# Patient Record
Sex: Female | Born: 1955 | Race: White | Hispanic: No | Marital: Married | Smoking: Former smoker
Health system: Southern US, Community
[De-identification: ages and names within clinical notes are randomized; demographics above are authoritative.]

## PROBLEM LIST (undated history)

## (undated) DIAGNOSIS — G43909 Migraine, unspecified, not intractable, without status migrainosus: Secondary | ICD-10-CM

## (undated) DIAGNOSIS — F329 Major depressive disorder, single episode, unspecified: Secondary | ICD-10-CM

## (undated) DIAGNOSIS — F32A Depression, unspecified: Secondary | ICD-10-CM

## (undated) HISTORY — PX: CHOLECYSTECTOMY: SHX55

---

## 1998-12-09 ENCOUNTER — Other Ambulatory Visit: Admission: RE | Admit: 1998-12-09 | Discharge: 1998-12-09 | Payer: Self-pay | Admitting: Obstetrics & Gynecology

## 1999-05-19 ENCOUNTER — Other Ambulatory Visit: Admission: RE | Admit: 1999-05-19 | Discharge: 1999-05-19 | Payer: Self-pay | Admitting: Obstetrics & Gynecology

## 1999-05-27 ENCOUNTER — Encounter: Payer: Self-pay | Admitting: Obstetrics & Gynecology

## 1999-05-27 ENCOUNTER — Ambulatory Visit (HOSPITAL_COMMUNITY): Admission: RE | Admit: 1999-05-27 | Discharge: 1999-05-27 | Payer: Self-pay | Admitting: Obstetrics & Gynecology

## 2011-03-13 DIAGNOSIS — F32A Depression, unspecified: Secondary | ICD-10-CM | POA: Insufficient documentation

## 2011-03-13 DIAGNOSIS — F329 Major depressive disorder, single episode, unspecified: Secondary | ICD-10-CM | POA: Insufficient documentation

## 2011-09-02 DIAGNOSIS — G43909 Migraine, unspecified, not intractable, without status migrainosus: Secondary | ICD-10-CM | POA: Insufficient documentation

## 2012-10-16 ENCOUNTER — Emergency Department (INDEPENDENT_AMBULATORY_CARE_PROVIDER_SITE_OTHER)
Admission: EM | Admit: 2012-10-16 | Discharge: 2012-10-16 | Disposition: A | Discharge status: Home / Self Care | Payer: BC Managed Care – PPO | Source: Home / Self Care | Attending: Emergency Medicine | Admitting: Emergency Medicine

## 2012-10-16 DIAGNOSIS — J322 Chronic ethmoidal sinusitis: Secondary | ICD-10-CM

## 2012-10-16 HISTORY — DX: Major depressive disorder, single episode, unspecified: F32.9

## 2012-10-16 HISTORY — DX: Depression, unspecified: F32.A

## 2012-10-16 MED ORDER — AMOXICILLIN-POT CLAVULANATE 875-125 MG PO TABS: 1.0000 | ORAL_TABLET | Freq: Two times a day (BID) | ORAL | Status: DC

## 2012-10-16 NOTE — ED Provider Notes (Signed)
SINUSITIS  Onset: 21 days Facial/sinus pressure with discolored nasal mucus.    Severity: moderate, much worse in the past few days  Tried OTC meds with minimal relief.  Symptoms:  + Fever  + URI prodrome with nasal congestion + Minimal swollen neck glands + mild Sinus Headache No ear pressure  No Allergy symptoms No significant Sore Throat No eye symptoms     No significant Cough No chest pain No shortness of breath  No wheezing  No Abdominal Pain No Nausea No Vomiting No diarrhea  No Myalgias No focal neurologic symptoms No syncope No Rash  No Urinary symptoms        CSN: [id number]     Arrival date & time 10/16/12  1254 History   First MD Initiated Contact with Patient 10/16/12 1308     Chief Complaint  Patient presents with  . Facial Pain    The history is provided by the patient.    Past Medical History  Diagnosis Date  . Depression    Past Surgical History  Procedure Laterality Date  . Cholecystectomy     Family History  Problem Relation Age of Onset  . Cancer Father   . Cancer Sister     breast   History  Substance Use Topics  . Smoking status: Never Smoker   . Smokeless tobacco: Not on file  . Alcohol Use: No   OB History   Grav Para Term Preterm Abortions TAB SAB Ect Mult Living                 Review of Systems  All other systems reviewed and are negative.    Allergies  Review of patient's allergies indicates no known allergies.  Home Medications   Current Outpatient Rx  Name  Route  Sig  Dispense  Refill  . venlafaxine XR (EFFEXOR-XR) 150 MG 24 hr capsule   Oral   Take 150 mg by mouth daily.         Marland Kitchen amoxicillin-clavulanate (AUGMENTIN) 875-125 MG per tablet   Oral   Take 1 tablet by mouth 2 (two) times daily. Take with food.   20 tablet   0    BP 123/73  Pulse 74  Temp(Src) 98.1 F (36.7 C) (Oral)  Ht 5\' 7"  (1.702 m)  Wt 196 lb (88.905 kg)  BMI 30.69 kg/m2  SpO2 96% Physical Exam  Nursing note and  vitals reviewed. Constitutional: She is oriented to person, place, and time. She appears well-developed and well-nourished. No distress.  HENT:  Head: Normocephalic and atraumatic.  Right Ear: Tympanic membrane, external ear and ear canal normal.  Left Ear: Tympanic membrane, external ear and ear canal normal.  Nose: Mucosal edema and rhinorrhea present. Right sinus exhibits maxillary sinus tenderness. Left sinus exhibits maxillary sinus tenderness.  Mouth/Throat: Oropharynx is clear and moist. No oral lesions. No oropharyngeal exudate.  Eyes: Right eye exhibits no discharge. Left eye exhibits no discharge. No scleral icterus.  Neck: Neck supple.  Cardiovascular: Normal rate, regular rhythm and normal heart sounds.   Pulmonary/Chest: Effort normal and breath sounds normal. She has no wheezes. She has no rales.  Lymphadenopathy:    She has no cervical adenopathy.  Neurological: She is alert and oriented to person, place, and time.  Skin: Skin is warm and dry.    ED Course  Procedures (including critical care time) Labs Review Labs Reviewed - No data to display Imaging Review No results found.  MDM   1. Ethmoid sinusitis  Treatment options discussed. After risks, benefits, alternatives discussed, patient agrees with the following plans: Augmentin 875 twice a day x10 days.  Mucinex D twice a day Other symptomatic care discussed. Excedrin when necessary headache, as that has been effective for sinus headache Follow-up with your primary care doctor in 5-7 days if not improving, or sooner if symptoms become worse. Precautions discussed. Red flags discussed. Questions invited and answered. Patient voiced understanding and agreement.    Lajean Manes, MD 10/16/12 1335

## 2012-10-16 NOTE — ED Notes (Signed)
Symptoms started one month ago and is getting worse, noted with productive cough and headaches; rates pain 8/10

## 2012-12-04 ENCOUNTER — Encounter: Payer: Self-pay | Admitting: Emergency Medicine

## 2012-12-04 ENCOUNTER — Emergency Department (INDEPENDENT_AMBULATORY_CARE_PROVIDER_SITE_OTHER)
Admission: EM | Admit: 2012-12-04 | Discharge: 2012-12-04 | Disposition: A | Discharge status: Home / Self Care | Payer: BC Managed Care – PPO | Source: Home / Self Care | Attending: Family Medicine | Admitting: Family Medicine

## 2012-12-04 DIAGNOSIS — J069 Acute upper respiratory infection, unspecified: Secondary | ICD-10-CM

## 2012-12-04 DIAGNOSIS — J029 Acute pharyngitis, unspecified: Secondary | ICD-10-CM

## 2012-12-04 LAB — POCT RAPID STREP A (OFFICE): Rapid Strep A Screen: NEGATIVE

## 2012-12-04 MED ORDER — PREDNISONE 20 MG PO TABS: 20.0000 mg | ORAL_TABLET | Freq: Two times a day (BID) | ORAL | Status: DC

## 2012-12-04 MED ORDER — ALBUTEROL SULFATE HFA 108 (90 BASE) MCG/ACT IN AERS: 2.0000 | INHALATION_SPRAY | RESPIRATORY_TRACT | Status: DC | PRN

## 2012-12-04 MED ORDER — AMOXICILLIN 875 MG PO TABS: 875.0000 mg | ORAL_TABLET | Freq: Two times a day (BID) | ORAL | Status: DC

## 2012-12-04 MED ORDER — BENZONATATE 200 MG PO CAPS: 200.0000 mg | ORAL_CAPSULE | Freq: Every day | ORAL | Status: DC

## 2012-12-04 NOTE — ED Notes (Signed)
Tammy Carrillo complains of sore throat and left ear pain for 3 days. She feels tired and has chills. Denies fever or sweats. She had a little dizziness yesterday.

## 2012-12-04 NOTE — ED Provider Notes (Signed)
CSN: [id number]     Arrival date & time 12/04/12  1100 History   First MD Initiated Contact with Patient 12/04/12 1112     Chief Complaint  Patient presents with  . Sore Throat    x 3 days  . Otalgia    left ear pain for 3 days      HPI Comments: Patient developed a left sore throat and fatigue 3 days ago.  Yesterday she had mild left earache and neck soreness.  She has had chills.  No cough or nasal congestion. She has a history of mild asthma but only needs to use her albuterol MDI when she gets a cold.  The history is provided by the patient.    Past Medical History  Diagnosis Date  . Depression    Past Surgical History  Procedure Laterality Date  . Cholecystectomy     Family History  Problem Relation Age of Onset  . Cancer Father   . Cancer Sister     breast   History  Substance Use Topics  . Smoking status: Never Smoker   . Smokeless tobacco: Not on file  . Alcohol Use: No   OB History   Grav Para Term Preterm Abortions TAB SAB Ect Mult Living                 Review of Systems + sore throat No cough No pleuritic pain No wheezing No nasal congestion ? post-nasal drainage No sinus pain/pressure No itchy/red eyes ? left earache No hemoptysis No SOB No fever, + chills No nausea + dizzy No vomiting No abdominal pain No diarrhea No urinary symptoms No skin rash + fatigue No myalgias No headache    Allergies  Review of patient's allergies indicates no known allergies.  Home Medications   Current Outpatient Rx  Name  Route  Sig  Dispense  Refill  . venlafaxine XR (EFFEXOR-XR) 150 MG 24 hr capsule   Oral   Take 150 mg by mouth daily.         Marland Kitchen albuterol (PROVENTIL HFA;VENTOLIN HFA) 108 (90 BASE) MCG/ACT inhaler   Inhalation   Inhale 2 puffs into the lungs every 4 (four) hours as needed for wheezing or shortness of breath.   1 Inhaler   0   . amoxicillin (AMOXIL) 875 MG tablet   Oral   Take 1 tablet (875 mg total) by mouth 2 (two) times  daily. (Rx void after 12/12/12)   20 tablet   0   . benzonatate (TESSALON) 200 MG capsule   Oral   Take 1 capsule (200 mg total) by mouth at bedtime. Take as needed for cough   12 capsule   0   . predniSONE (DELTASONE) 20 MG tablet   Oral   Take 1 tablet (20 mg total) by mouth 2 (two) times daily.   10 tablet   0    BP 144/78  Pulse 72  Temp(Src) 98 F (36.7 C) (Oral)  Ht 5\' 7"  (1.702 m)  Wt 194 lb (87.998 kg)  BMI 30.38 kg/m2  SpO2 96% Physical Exam Nursing notes and Vital Signs reviewed. Appearance:  Patient appears stated age, and in no acute distress.  Patient is obese (BMI 30.4) Eyes:  Pupils are equal, round, and reactive to light and accomodation.  Extraocular movement is intact.  Conjunctivae are not inflamed  Ears:  Canals normal.  Tympanic membranes normal.  Nose:  Mildly congested turbinates.  No sinus tenderness.  Pharynx:  Normal Neck:  Supple.   Tender shotty posterior nodes are palpated bilaterally  Lungs:  Clear to auscultation.  Breath sounds are equal.  Heart:  Regular rate and rhythm without murmurs, rubs, or gallops.  Abdomen:  Nontender without masses or hepatosplenomegaly.  Bowel sounds are present.  No CVA or flank tenderness.  Extremities:  No edema.  No calf tenderness Skin:  No rash present.    ED Course  Procedures  none    Labs Reviewed  POCT RAPID STREP A (OFFICE) - Normal         MDM   1. Sore throat   2. Acute upper respiratory infections of unspecified site    Throat culture pending. There is no evidence of bacterial infection today.  Treat symptomatically for now  With a past history of mild asthma, will begin a Prednisone burst.  Refill albuterol MDI. If cough develops, begin Tessalon perles at bedtime. Take Mucinex D (guaifenesin with decongestant) twice daily for cough and congestion.  Increase fluid intake, rest. May use Afrin nasal spray (or generic oxymetazoline) twice daily for about 5 days.  Also recommend using  saline nasal spray several times daily and saline nasal irrigation (AYR is a common brand) Stop all antihistamines for now, and other non-prescription cough/cold preparations. Continue albuterol inhaler as needed Begin Amoxicillin if not improving about one week or if persistent fever develops (Given a prescription to hold, with an expiration date)  Follow-up with family doctor if not improving about10 days.     Lattie Haw, MD 12/04/12 1144

## 2012-12-05 LAB — STREP A DNA PROBE: GASP: NEGATIVE

## 2012-12-06 ENCOUNTER — Telehealth: Payer: Self-pay | Admitting: *Deleted

## 2013-01-07 ENCOUNTER — Emergency Department (INDEPENDENT_AMBULATORY_CARE_PROVIDER_SITE_OTHER)
Admission: EM | Admit: 2013-01-07 | Discharge: 2013-01-07 | Disposition: A | Discharge status: Home / Self Care | Payer: BC Managed Care – PPO | Source: Home / Self Care | Attending: Emergency Medicine | Admitting: Emergency Medicine

## 2013-01-07 ENCOUNTER — Encounter: Payer: Self-pay | Admitting: Emergency Medicine

## 2013-01-07 DIAGNOSIS — J111 Influenza due to unidentified influenza virus with other respiratory manifestations: Secondary | ICD-10-CM

## 2013-01-07 MED ORDER — OSELTAMIVIR PHOSPHATE 75 MG PO CAPS: ORAL_CAPSULE | ORAL | Status: DC

## 2013-01-07 NOTE — ED Notes (Addendum)
Tammy Carrillo complains of chills, body aches and runny nose since this morning. Denies fever or sweats. She states she has been exposed to the flu virus. She would prefer not to get a flu test.

## 2013-01-07 NOTE — ED Provider Notes (Addendum)
CSN: [id number]     Arrival date & time 01/07/13  1029 History   First MD Initiated Contact with Patient 01/07/13 1053     Chief Complaint  Patient presents with  . Chills    this am  . Generalized Body Aches    this am  . Nasal Congestion    this am   (Consider location/radiation/quality/duration/timing/severity/associated sxs/prior Treatment) HPI Tammy Carrillo complains of chills, body aches and runny nose since this morning. Denies fever or sweats. She works as a Runner, broadcasting/film/video, and describes that 2 days ago, a Consulting civil engineer sneezed in her face, later reportedly had influenza with a documentED positive flu test. She would prefer not to get a flu test. --She specifically requests to start Tamiflu now  She has not had a flu shot this season.  URI HISTORY  Alyssandra is a 57 y.o. female who complains of onset of cold and flu symptoms for 1 day.  Has not been using any over-the-counter treatment.  Positive chills/sweats, and low-grade fever  +  Minimal Nasal congestion No Discolored Post-nasal drainage No sinus pain/pressure No sore throat  No  cough No wheezing No chest congestion No hemoptysis No shortness of breath No pleuritic pain  No itchy/red eyes No earache  No nausea No vomiting No abdominal pain No diarrhea  No skin rashes +  Fatigue Positive, severe myalgias No headache  Past Medical History  Diagnosis Date  . Depression    Past Surgical History  Procedure Laterality Date  . Cholecystectomy     Family History  Problem Relation Age of Onset  . Cancer Father   . Stroke Father   . Diabetes Father   . Cancer Sister     breast   History  Substance Use Topics  . Smoking status: Never Smoker   . Smokeless tobacco: Not on file  . Alcohol Use: No   OB History   Grav Para Term Preterm Abortions TAB SAB Ect Mult Living                 Review of Systems  All other systems reviewed and are negative.    Allergies  Review of patient's allergies indicates no  known allergies.  Home Medications   Current Outpatient Rx  Name  Route  Sig  Dispense  Refill  . venlafaxine XR (EFFEXOR-XR) 150 MG 24 hr capsule   Oral   Take 150 mg by mouth daily.         . benzonatate (TESSALON) 200 MG capsule   Oral   Take 1 capsule (200 mg total) by mouth at bedtime. Take as needed for cough   12 capsule   0   . oseltamivir (TAMIFLU) 75 MG capsule      Starting today, take 1 capsule by mouth twice a day for 5 days.   10 capsule   0    BP 138/87  Pulse 72  Temp(Src) 98.1 F (36.7 C) (Oral)  Ht 5\' 7"  (1.702 m)  Wt 193 lb (87.544 kg)  BMI 30.22 kg/m2  SpO2 96% Physical Exam  Nursing note and vitals reviewed. Constitutional: She appears well-developed and well-nourished.  Non-toxic appearance. She appears ill (very fatigued, but no cardiorespiratory distress). No distress.  HENT:  Head: Normocephalic and atraumatic.  Right Ear: Tympanic membrane and external ear normal.  Left Ear: Tympanic membrane and external ear normal.  Nose: Rhinorrhea present.  Mouth/Throat: Mucous membranes are normal. Posterior oropharyngeal erythema (mild redness ) present. No oropharyngeal exudate.  Eyes: Conjunctivae  are normal. Right eye exhibits no discharge. Left eye exhibits no discharge. No scleral icterus.  Neck: Neck supple.  Cardiovascular: Normal rate, regular rhythm and normal heart sounds.   Pulmonary/Chest: Breath sounds normal. No stridor. No respiratory distress. She has no wheezes. She has no rales.  Abdominal: Soft. There is no tenderness.  Musculoskeletal: She exhibits no edema.  Lymphadenopathy:    She has cervical adenopathy (mild shoddy anterior cervical nodes).  Neurological: She is alert.  Skin: Skin is warm and intact. No rash noted. She is diaphoretic.  Psychiatric: She has a normal mood and affect.    ED Course  Procedures (including critical care time) Labs Review Labs Reviewed - No data to display Imaging Review No results  found.  EKG Interpretation    Date/Time:    Ventricular Rate:    PR Interval:    QRS Duration:   QT Interval:    QTC Calculation:   R Axis:     Text Interpretation:              MDM   1. Influenza with respiratory manifestations    Treatment options discussed. By her history and physical findings, there is a high likelihood that this is influenza. She declined flu test. Rx for Tamiflu 75 twice a day x5 days. Other symptomatic care discussed Followup with PCP if no better one week, sooner when necessary Precautions discussed. Red flags discussed. Questions invited and answered. Patient voiced understanding and agreement.  Lajean Manes, MD 01/07/13 1107  Addendum: Also advised patient that once this acute illness has resolved, she should get the seasonal flu shot.-She voiced understanding and agreement.  Lajean Manes, MD 01/07/13 [phone]

## 2013-04-15 ENCOUNTER — Emergency Department (INDEPENDENT_AMBULATORY_CARE_PROVIDER_SITE_OTHER)
Admission: EM | Admit: 2013-04-15 | Discharge: 2013-04-15 | Disposition: A | Discharge status: Home / Self Care | Payer: BC Managed Care – PPO | Source: Home / Self Care | Attending: Family Medicine | Admitting: Family Medicine

## 2013-04-15 ENCOUNTER — Encounter: Payer: Self-pay | Admitting: Emergency Medicine

## 2013-04-15 DIAGNOSIS — J069 Acute upper respiratory infection, unspecified: Secondary | ICD-10-CM

## 2013-04-15 MED ORDER — BENZONATATE 200 MG PO CAPS: 200.0000 mg | ORAL_CAPSULE | Freq: Every day | ORAL | Status: DC

## 2013-04-15 MED ORDER — AMOXICILLIN 875 MG PO TABS: 875.0000 mg | ORAL_TABLET | Freq: Two times a day (BID) | ORAL | Status: DC

## 2013-04-15 MED ORDER — PREDNISONE 20 MG PO TABS: 20.0000 mg | ORAL_TABLET | Freq: Two times a day (BID) | ORAL | Status: DC

## 2013-04-15 NOTE — Discharge Instructions (Signed)
Take plain Mucinex (1200 mg guaifenesin) twice daily for cough and congestion.  May add Sudafed for sinus congestion.   Increase fluid intake, rest. May use Afrin nasal spray (or generic oxymetazoline) twice daily for about 5 days.  Also recommend using saline nasal spray several times daily and saline nasal irrigation (AYR is a common brand).  Use Flonase after Afrin spray and saline rinse.  Stop all antihistamines for now, and other non-prescription cough/cold preparations.  Follow-up with family doctor if not improving 7 to 10 days.

## 2013-04-15 NOTE — ED Provider Notes (Signed)
CSN: [id number]     Arrival date & time 04/15/13  1531 History   First MD Initiated Contact with Patient 04/15/13 1551     Chief Complaint  Patient presents with  . Headache  . Nasal Congestion  . Fatigue   (Consider location/radiation/quality/duration/timing/severity/associated sxs/prior Treatment) HPI Comments: Patient developed sinus congestion about 6 days ago, then scratchy throat, fatigue, and headache about 4 days ago.  She developed chills and cough yesterday.  She has occasional wheezing and has resumed using her albuterol inhaler.  She also resumed using a Flonase nasal inhaler.  The history is provided by the patient.    Past Medical History  Diagnosis Date  . Depression    Past Surgical History  Procedure Laterality Date  . Cholecystectomy     Family History  Problem Relation Age of Onset  . Cancer Father   . Stroke Father   . Diabetes Father   . Cancer Sister     breast   History  Substance Use Topics  . Smoking status: Never Smoker   . Smokeless tobacco: Not on file  . Alcohol Use: No   OB History   Grav Para Term Preterm Abortions TAB SAB Ect Mult Living                 Review of Systems + sore throat + cough No pleuritic pain + wheezing + nasal congestion + post-nasal drainage No sinus pain/pressure No itchy/red eyes No earache No hemoptysis No SOB No fever, + chills No nausea No vomiting No abdominal pain No diarrhea No urinary symptoms No skin rash + fatigue No myalgias + headache Used OTC meds without relief  Allergies  Review of patient's allergies indicates no known allergies.  Home Medications   Current Outpatient Rx  Name  Route  Sig  Dispense  Refill  . amoxicillin (AMOXIL) 875 MG tablet   Oral   Take 1 tablet (875 mg total) by mouth 2 (two) times daily.   20 tablet   0   . benzonatate (TESSALON) 200 MG capsule   Oral   Take 1 capsule (200 mg total) by mouth at bedtime. Take as needed for cough   12 capsule    0   . predniSONE (DELTASONE) 20 MG tablet   Oral   Take 1 tablet (20 mg total) by mouth 2 (two) times daily. Take with food.   10 tablet   0   . venlafaxine XR (EFFEXOR-XR) 150 MG 24 hr capsule   Oral   Take 150 mg by mouth daily.          BP 131/85  Pulse 83  Temp(Src) 97.8 F (36.6 C) (Oral)  Resp 16  Ht 5\' 7"  (1.702 m)  Wt 194 lb (87.998 kg)  BMI 30.38 kg/m2  SpO2 96% Physical Exam Nursing notes and Vital Signs reviewed. Appearance:  Patient appears stated age, and in no acute distress.  Patient is obese (BMI 30.4) Eyes:  Pupils are equal, round, and reactive to light and accomodation.  Extraocular movement is intact.  Conjunctivae are not inflamed  Ears:  Canals normal.  Tympanic membranes normal.  Nose:  Mildly congested turbinates.  No sinus tenderness.  Pharynx:  Normal Neck:  Supple.   Tender enlarged posterior nodes are palpated bilaterally  Lungs:  Clear to auscultation.  Breath sounds are equal.  Heart:  Regular rate and rhythm without murmurs, rubs, or gallops.  Abdomen:  Nontender without masses or hepatosplenomegaly.  Bowel sounds are present.  No CVA or flank tenderness.  Extremities:  No edema.  No calf tenderness Skin:  No rash present.   ED Course  Procedures  none      MDM   1. Acute upper respiratory infections of unspecified site; suspect viral URI with intermittent bronchospasm    Begin amoxicillin and prednisone burst.  Prescription written for Benzonatate (Tessalon) to take at bedtime for night-time cough.  Take plain Mucinex (1200 mg guaifenesin) twice daily for cough and congestion.  May add Sudafed for sinus congestion.   Increase fluid intake, rest. May use Afrin nasal spray (or generic oxymetazoline) twice daily for about 5 days.  Also recommend using saline nasal spray several times daily and saline nasal irrigation (AYR is a common brand).  Use Flonase after Afrin spray and saline rinse.  Stop all antihistamines for now, and other  non-prescription cough/cold preparations.  Follow-up with family doctor if not improving 7 to 10 days.     Kandra Nicolas, MD 04/15/13 [phone]

## 2013-04-15 NOTE — ED Notes (Signed)
Reports 4 day history of congestion with addition of headaches and fatigue. Took Day-Quil this a.m.Marland Kitchen

## 2013-06-22 ENCOUNTER — Emergency Department (INDEPENDENT_AMBULATORY_CARE_PROVIDER_SITE_OTHER)
Admission: EM | Admit: 2013-06-22 | Discharge: 2013-06-22 | Disposition: A | Discharge status: Home / Self Care | Payer: BC Managed Care – PPO | Source: Home / Self Care | Attending: Emergency Medicine | Admitting: Emergency Medicine

## 2013-06-22 ENCOUNTER — Encounter: Payer: Self-pay | Admitting: Emergency Medicine

## 2013-06-22 DIAGNOSIS — J209 Acute bronchitis, unspecified: Secondary | ICD-10-CM

## 2013-06-22 DIAGNOSIS — J0100 Acute maxillary sinusitis, unspecified: Secondary | ICD-10-CM

## 2013-06-22 MED ORDER — CLARITHROMYCIN 500 MG PO TABS: ORAL_TABLET | ORAL | Status: DC

## 2013-06-22 MED ORDER — PREDNISONE 20 MG PO TABS: 20.0000 mg | ORAL_TABLET | Freq: Two times a day (BID) | ORAL | Status: DC

## 2013-06-22 MED ORDER — IPRATROPIUM-ALBUTEROL 0.5-2.5 (3) MG/3ML IN SOLN
3.0000 mL | Freq: Once | RESPIRATORY_TRACT | Status: AC
  Administered 2013-06-22: 3 mL via RESPIRATORY_TRACT

## 2013-06-22 NOTE — ED Provider Notes (Signed)
CSN: [id number]     Arrival date & time 06/22/13  1221 History   First MD Initiated Contact with Patient 06/22/13 1249     Chief Complaint  Patient presents with  . Nasal Congestion   HPI URI HISTORY  Vida is a 58 y.o. female who complains of onset of cold symptoms for several days. Cough, congestion, sinus pain, pressure, sweating x 2 weeks worse last two days.   Had mild wheezing with chest congestion, tried albuterol rescue inhaler, which helped a little.  She states that the last time she needed a rescue inhaler was about a year ago.  + chills/sweats +  Fever  +  Nasal congestion +  Discolored Post-nasal drainage + sinus pain/pressure No sore throat  +  Hacking cough, productive of discolored sputum + wheezing + chest congestion No hemoptysis Mild shortness of breath when she is wheezing. No pleuritic pain  No itchy/red eyes No earache  Minimal nausea. Decreased appetite but tolerating by mouth  No vomiting No abdominal pain No diarrhea  No skin rashes +  Fatigue No myalgias Mild, nonfocal headache, without any focal neurologic symptoms. No lightheadedness or syncope   Past Medical History  Diagnosis Date  . Depression    Past Surgical History  Procedure Laterality Date  . Cholecystectomy     Family History  Problem Relation Age of Onset  . Cancer Father   . Stroke Father   . Diabetes Father   . Cancer Sister     breast   History  Substance Use Topics  . Smoking status: Never Smoker   . Smokeless tobacco: Not on file  . Alcohol Use: No   OB History   Grav Para Term Preterm Abortions TAB SAB Ect Mult Living                 Review of Systems  All other systems reviewed and are negative.   Allergies  Review of patient's allergies indicates not on file.  Home Medications   Prior to Admission medications   Medication Sig Start Date End Date Taking? Authorizing Provider  phenylephrine (SUDAFED PE) 10 MG TABS tablet Take 10 mg by mouth  every 4 (four) hours as needed.   Yes Historical Provider, MD  amoxicillin (AMOXIL) 875 MG tablet Take 1 tablet (875 mg total) by mouth 2 (two) times daily. 04/15/13   Kandra Nicolas, MD  benzonatate (TESSALON) 200 MG capsule Take 1 capsule (200 mg total) by mouth at bedtime. Take as needed for cough 04/15/13   Kandra Nicolas, MD  predniSONE (DELTASONE) 20 MG tablet Take 1 tablet (20 mg total) by mouth 2 (two) times daily. Take with food. 04/15/13   Kandra Nicolas, MD  venlafaxine XR (EFFEXOR-XR) 150 MG 24 hr capsule Take 150 mg by mouth daily.    Historical Provider, MD   BP 148/87  Pulse 86  Temp(Src) 97.8 F (36.6 C) (Oral)  Ht 5\' 7"  (1.702 m)  Wt 190 lb (86.183 kg)  BMI 29.75 kg/m2  SpO2 98% Physical Exam  Nursing note and vitals reviewed. Constitutional: She is oriented to person, place, and time. She appears well-developed and well-nourished. No distress.  HENT:  Head: Normocephalic and atraumatic.  Right Ear: Tympanic membrane, external ear and ear canal normal.  Left Ear: Tympanic membrane, external ear and ear canal normal.  Nose: Mucosal edema and rhinorrhea present. Right sinus exhibits maxillary sinus tenderness. Left sinus exhibits maxillary sinus tenderness.  Mouth/Throat: Oropharynx is clear and moist.  No oral lesions. No oropharyngeal exudate.  Eyes: Right eye exhibits no discharge. Left eye exhibits no discharge. No scleral icterus.  Neck: Neck supple.  Cardiovascular: Normal rate, regular rhythm and normal heart sounds.   Pulmonary/Chest: Effort normal. She has wheezes. She has rhonchi. She has no rales.  Lymphadenopathy:    She has no cervical adenopathy.  Neurological: She is alert and oriented to person, place, and time.  Skin: Skin is warm and dry.  Psychiatric: She has a normal mood and affect.   Pulse ox 98% room air.  ED Course  Procedures (including critical care time) Labs Review Labs Reviewed - No data to display  Imaging Review No results  found.   MDM   1. Acute bronchitis with bronchospasm   2. Acute maxillary sinusitis     Treatment options discussed, as well as risks, benefits, alternatives. Patient voiced understanding and agreement with the following plans:  DuoNeb nebulizer treatment given. Wheezing improved. Rxs:  Biaxin 500 twice a day x10 days Prednisone 20 mg twice a day x5 days Home albuterol rescue inhaler every 4-6 hours as needed. Rest, push fluids, other symptomatic care discussed.  Follow-up with your primary care doctor in 5-7 days if not improving, or sooner if symptoms become worse. Precautions discussed. Red flags discussed. Questions invited and answered. Patient voiced understanding and agreement.     Jacqulyn Cane, MD 06/22/13 1304

## 2013-06-22 NOTE — ED Notes (Signed)
Cough, congestion, sinus pain, pressure, sweating x 2 weeks worse last two days

## 2013-09-30 ENCOUNTER — Emergency Department (INDEPENDENT_AMBULATORY_CARE_PROVIDER_SITE_OTHER)
Admission: EM | Admit: 2013-09-30 | Discharge: 2013-09-30 | Disposition: A | Discharge status: Home / Self Care | Payer: BC Managed Care – PPO | Source: Home / Self Care

## 2013-09-30 ENCOUNTER — Encounter: Payer: Self-pay | Admitting: Emergency Medicine

## 2013-09-30 DIAGNOSIS — J322 Chronic ethmoidal sinusitis: Secondary | ICD-10-CM

## 2013-09-30 HISTORY — DX: Migraine, unspecified, not intractable, without status migrainosus: G43.909

## 2013-09-30 MED ORDER — AMOXICILLIN-POT CLAVULANATE 875-125 MG PO TABS
1.0000 | ORAL_TABLET | Freq: Two times a day (BID) | ORAL | Status: DC
Start: 2013-09-30 — End: 2014-03-17

## 2013-09-30 MED ORDER — CETIRIZINE-PSEUDOEPHEDRINE ER 5-120 MG PO TB12: 1.0000 | ORAL_TABLET | Freq: Two times a day (BID) | ORAL | Status: DC

## 2013-09-30 NOTE — ED Notes (Signed)
Frontal HA different from migraines.  Asso with vertigo, burning on L inner nose. Tired

## 2013-09-30 NOTE — Discharge Instructions (Signed)

## 2013-09-30 NOTE — ED Provider Notes (Signed)
CSN: [id number]     Arrival date & time 09/30/13  1017 History   None    Chief Complaint  Patient presents with  . Headache    Hx of migraines but this HA different.    . Dizziness   (Consider location/radiation/quality/duration/timing/severity/associated sxs/prior Treatment) Patient is a 58 y.o. female presenting with headaches and dizziness. The history is provided by the patient. No language interpreter was used.  Headache Pain location:  Frontal Quality:  Sharp Radiates to:  Does not radiate Severity currently:  7/10 Severity at highest:  7/10 Onset quality:  Gradual Duration:  1 week Timing:  Constant Progression:  Worsening Chronicity:  New Similar to prior headaches: yes   Relieved by:  Nothing Worsened by:  Nothing tried Ineffective treatments:  None tried Associated symptoms: dizziness, sinus pressure and sore throat   Risk factors: no anger   Dizziness Associated symptoms: headaches     Past Medical History  Diagnosis Date  . Depression   . Migraines    Past Surgical History  Procedure Laterality Date  . Cholecystectomy     Family History  Problem Relation Age of Onset  . Cancer Father   . Stroke Father   . Diabetes Father   . Cancer Sister     breast   History  Substance Use Topics  . Smoking status: Never Smoker   . Smokeless tobacco: Not on file  . Alcohol Use: No   OB History   Grav Para Term Preterm Abortions TAB SAB Ect Mult Living                 Review of Systems  HENT: Positive for sinus pressure and sore throat.   Neurological: Positive for dizziness and headaches.  All other systems reviewed and are negative.   Allergies  Review of patient's allergies indicates no known allergies.  Home Medications   Prior to Admission medications   Medication Sig Start Date End Date Taking? Authorizing Provider  clarithromycin (BIAXIN) 500 MG tablet Take 1 twice a day for 10 days. 06/22/13   Jacqulyn Cane, MD  phenylephrine (SUDAFED PE) 10  MG TABS tablet Take 10 mg by mouth every 4 (four) hours as needed.    Historical Provider, MD  predniSONE (DELTASONE) 20 MG tablet Take 1 tablet (20 mg total) by mouth 2 (two) times daily with a meal. X 5 days 06/22/13   Jacqulyn Cane, MD  venlafaxine XR (EFFEXOR-XR) 150 MG 24 hr capsule Take 150 mg by mouth daily.    Historical Provider, MD   BP 121/80  Pulse 71  Temp(Src) 98.1 F (36.7 C) (Oral)  Ht 5\' 7"  (1.702 m)  Wt 190 lb (86.183 kg)  BMI 29.75 kg/m2  SpO2 97% Physical Exam  Nursing note and vitals reviewed. Constitutional: She is oriented to person, place, and time. She appears well-developed and well-nourished.  HENT:  Head: Normocephalic and atraumatic.  Right Ear: External ear normal.  Left Ear: External ear normal.  Eyes: Conjunctivae and EOM are normal. Pupils are equal, round, and reactive to light.  Neck: Normal range of motion.  Cardiovascular: Normal rate, regular rhythm and normal heart sounds.   Pulmonary/Chest: Effort normal.  Abdominal: Soft. She exhibits no distension.  Musculoskeletal: Normal range of motion.  Neurological: She is alert and oriented to person, place, and time.  Skin: Skin is warm.  Psychiatric: She has a normal mood and affect.    ED Course  Procedures (including critical care time) Labs Review Labs Reviewed -  No data to display  Imaging Review No results found.   MDM Pt has had some dizziness, no nystagmus,  No neuro deficits.  I suspect 2nd to sinus congestion   1. Ethmoid sinusitis, unspecified chronicity    Augmentin  Zyrtec d Return if any problems.    [place], PA-C 09/30/13 1047

## 2013-10-05 NOTE — ED Provider Notes (Signed)
Medical history/examination/treatment/procedure(s) were performed by non-physician provider and as supervising physician I was immediately available for consultation/collaboration.   Jacqulyn Cane, MD 10/05/13 [phone]

## 2013-11-29 ENCOUNTER — Emergency Department
Admission: EM | Admit: 2013-11-29 | Discharge: 2013-11-29 | Disposition: A | Discharge status: Home / Self Care | Payer: BC Managed Care – PPO | Source: Home / Self Care | Attending: Emergency Medicine | Admitting: Emergency Medicine

## 2013-11-29 ENCOUNTER — Encounter: Payer: Self-pay | Admitting: *Deleted

## 2013-11-29 DIAGNOSIS — J039 Acute tonsillitis, unspecified: Secondary | ICD-10-CM

## 2013-11-29 DIAGNOSIS — J029 Acute pharyngitis, unspecified: Secondary | ICD-10-CM

## 2013-11-29 LAB — POCT RAPID STREP A (OFFICE): Rapid Strep A Screen: NEGATIVE

## 2013-11-29 MED ORDER — AMOXICILLIN 875 MG PO TABS
ORAL_TABLET | ORAL | Status: DC
Start: 2013-11-29 — End: 2014-03-17

## 2013-11-29 NOTE — ED Notes (Signed)
Pt c/o sore throat and chills x today. Denies fever.

## 2013-11-29 NOTE — ED Provider Notes (Signed)
CSN: [id number]     Arrival date & time 11/29/13  1612 History   First MD Initiated Contact with Patient 11/29/13 1629     Chief Complaint  Patient presents with  . Sore Throat  . Chills   (Consider location/radiation/quality/duration/timing/severity/associated sxs/prior Treatment) HPI SORE THROAT Onset: 2-3 days    Severity: moderate-severe Tried OTC meds without significant relief.  Symptoms:  + Fever and chills today + Swollen neck glands No Recent Strep Exposure     No Myalgias No Headache No Rash  No Discolored Nasal Mucus No Allergy symptoms No sinus pain/pressure No itchy/red eyes No earache  No Drooling No Trismus  No Nausea No Vomiting No Abdominal pain No Diarrhea No Reflux symptoms  No Cough No Breathing Difficulty No Shortness of Breath No pleuritic pain No Wheezing No Hemoptysis   Past Medical History  Diagnosis Date  . Depression   . Migraines    Past Surgical History  Procedure Laterality Date  . Cholecystectomy     Family History  Problem Relation Age of Onset  . Cancer Father   . Stroke Father   . Diabetes Father   . Cancer Sister     breast   History  Substance Use Topics  . Smoking status: Never Smoker   . Smokeless tobacco: Not on file  . Alcohol Use: No   OB History    No data available     Review of Systems  All other systems reviewed and are negative.   Allergies  Review of patient's allergies indicates no known allergies.  Home Medications   Prior to Admission medications   Medication Sig Start Date End Date Taking? Authorizing Provider  amoxicillin (AMOXIL) 875 MG tablet Take 1 twice a day X 10 days. 11/29/13   Jacqulyn Cane, MD  amoxicillin-clavulanate (AUGMENTIN) 875-125 MG per tablet Take 1 tablet by mouth 2 (two) times daily. 09/30/13   Fransico Meadow, PA-C  cetirizine-pseudoephedrine (ZYRTEC-D) 5-120 MG per tablet Take 1 tablet by mouth 2 (two) times daily. 09/30/13   Fransico Meadow, PA-C  clarithromycin  (BIAXIN) 500 MG tablet Take 1 twice a day for 10 days. 06/22/13   Jacqulyn Cane, MD  phenylephrine (SUDAFED PE) 10 MG TABS tablet Take 10 mg by mouth every 4 (four) hours as needed.    Historical Provider, MD  predniSONE (DELTASONE) 20 MG tablet Take 1 tablet (20 mg total) by mouth 2 (two) times daily with a meal. X 5 days 06/22/13   Jacqulyn Cane, MD  venlafaxine XR (EFFEXOR-XR) 150 MG 24 hr capsule Take 150 mg by mouth daily.    Historical Provider, MD   BP 138/81 mmHg  Pulse 77  Temp(Src) 98.4 F (36.9 C) (Oral)  Resp 18  Ht 5\' 7"  (1.702 m)  Wt 193 lb (87.544 kg)  BMI 30.22 kg/m2  SpO2 97% Physical Exam  Constitutional: She is oriented to person, place, and time. She appears well-developed and well-nourished.  Non-toxic appearance. She appears ill. No distress.  HENT:  Head: Normocephalic and atraumatic.  Right Ear: Tympanic membrane, external ear and ear canal normal.  Left Ear: Tympanic membrane, external ear and ear canal normal.  Nose: Nose normal. Right sinus exhibits no maxillary sinus tenderness and no frontal sinus tenderness. Left sinus exhibits no maxillary sinus tenderness and no frontal sinus tenderness.  Mouth/Throat: Uvula is midline and mucous membranes are normal. No oral lesions. Posterior oropharyngeal erythema present. No oropharyngeal exudate or tonsillar abscesses.  + Tonsillar enlargement.  Airway intact.  Eyes: Conjunctivae are normal. No scleral icterus.  Neck: Neck supple.  Cardiovascular: Normal rate, regular rhythm and normal heart sounds.   No murmur heard. Pulmonary/Chest: Effort normal and breath sounds normal. No stridor. No respiratory distress. She has no wheezes. She has no rhonchi. She has no rales.  Abdominal: Soft. She exhibits no mass. There is no hepatosplenomegaly. There is no tenderness.  Lymphadenopathy:    She has cervical adenopathy.       Right cervical: Superficial cervical adenopathy present. No deep cervical and no posterior cervical  adenopathy present.      Left cervical: Superficial cervical adenopathy present. No deep cervical and no posterior cervical adenopathy present.  Neurological: She is alert and oriented to person, place, and time.  Skin: Skin is warm. No rash noted.  Psychiatric: She has a normal mood and affect.  Nursing note and vitals reviewed.   ED Course  Procedures (including critical care time) Labs Review Labs Reviewed  STREP A DNA PROBE  POCT RAPID STREP A (OFFICE)    Imaging Review No results found.   MDM   1. Acute pharyngitis, unspecified pharyngitis type   2. Acute tonsillitis     Rapid strep test negative. Strep culture sent. Treatment options discussed, as well as risks, benefits, alternatives. Patient voiced understanding and agreement with the following plans: Amoxicillin prescribed Other symptomatic care Follow-up with your primary care doctor in 5-7 days if not improving, or sooner if symptoms become worse. Precautions discussed. Red flags discussed. Questions invited and answered. Patient voiced understanding and agreement.   Jacqulyn Cane, MD 12/02/13 [phone]

## 2013-11-30 LAB — STREP A DNA PROBE: GASP: NEGATIVE

## 2014-03-17 ENCOUNTER — Encounter: Payer: Self-pay | Admitting: Emergency Medicine

## 2014-03-17 ENCOUNTER — Emergency Department
Admission: EM | Admit: 2014-03-17 | Discharge: 2014-03-17 | Disposition: A | Discharge status: Home / Self Care | Payer: 59 | Source: Home / Self Care | Attending: Family Medicine | Admitting: Family Medicine

## 2014-03-17 DIAGNOSIS — J029 Acute pharyngitis, unspecified: Secondary | ICD-10-CM

## 2014-03-17 LAB — POCT RAPID STREP A (OFFICE): RAPID STREP A SCREEN: NEGATIVE

## 2014-03-17 MED ORDER — IPRATROPIUM BROMIDE 0.06 % NA SOLN: 2.0000 | Freq: Four times a day (QID) | NASAL | Status: DC

## 2014-03-17 MED ORDER — PREDNISONE 10 MG PO TABS: 30.0000 mg | ORAL_TABLET | Freq: Every day | ORAL | Status: DC

## 2014-03-17 NOTE — ED Provider Notes (Signed)
Tammy Carrillo is a 59 y.o. female who presents to Urgent Care today for sore throat. Patient has a 2 day history of sore throat worse with swallowing. Patient also notes sinus pain and pressure but denies any significant runny nose. She denies any vomiting diarrhea cough congestion shortness of breath or wheezing. She notes a mild fever at home with a temperature maximum of 100F. She's tried ibuprofen which helps.   Past Medical History  Diagnosis Date  . Depression   . Migraines    Past Surgical History  Procedure Laterality Date  . Cholecystectomy     History  Substance Use Topics  . Smoking status: Never Smoker   . Smokeless tobacco: Not on file  . Alcohol Use: No   ROS as above Medications: No current facility-administered medications for this encounter.   Current Outpatient Prescriptions  Medication Sig Dispense Refill  . ipratropium (ATROVENT) 0.06 % nasal spray Place 2 sprays into both nostrils 4 (four) times daily. 15 mL 1  . predniSONE (DELTASONE) 10 MG tablet Take 3 tablets (30 mg total) by mouth daily. 15 tablet 0  . venlafaxine XR (EFFEXOR-XR) 150 MG 24 hr capsule Take 150 mg by mouth daily.     No Known Allergies   Exam:  BP 137/87 mmHg  Pulse 76  Temp(Src) 97.6 F (36.4 C) (Oral)  Wt 196 lb (88.905 kg)  SpO2 97% Gen: Well NAD HEENT: EOMI,  MMM posterior pharynx is mildly erythematous with cobblestoning. Normal tympanic membranes bilaterally. No significant nasal discharge present. Inflamed nasal turbinates bilaterally. No significant cervical lymphadenopathy present. Lungs: Normal work of breathing. CTABL Heart: RRR no MRG Abd: NABS, Soft. Nondistended, Nontender Exts: Brisk capillary refill, warm and well perfused.   Results for orders placed or performed during the hospital encounter of 03/17/14 (from the past 24 hour(s))  POCT rapid strep A     Status: None   Collection Time: 03/17/14  9:31 AM  Result Value Ref Range   Rapid Strep A Screen  Negative Negative   No results found.  Assessment and Plan: 59 y.o. female with viral pharyngitis. Culture pending. Treatment with Atrovent nasal spray Tylenol and ibuprofen. Use prednisone if not better.  Discussed warning signs or symptoms. Please see discharge instructions. Patient expresses understanding.     Gregor Hams, MD 03/17/14 [phone]

## 2014-03-17 NOTE — ED Notes (Signed)
Pt c/o sore throat and HA starting Thursday. States she has not checked her temp but feels like she has had a fever.

## 2014-03-17 NOTE — Discharge Instructions (Signed)
Thank you for coming in today. Call or go to the emergency room if you get worse, have trouble breathing, have chest pains, or palpitations.  Use prednisone if not feeling better.  Pharyngitis Pharyngitis is redness, pain, and swelling (inflammation) of your pharynx.  CAUSES  Pharyngitis is usually caused by infection. Most of the time, these infections are from viruses (viral) and are part of a cold. However, sometimes pharyngitis is caused by bacteria (bacterial). Pharyngitis can also be caused by allergies. Viral pharyngitis may be spread from person to person by coughing, sneezing, and personal items or utensils (cups, forks, spoons, toothbrushes). Bacterial pharyngitis may be spread from person to person by more intimate contact, such as kissing.  SIGNS AND SYMPTOMS  Symptoms of pharyngitis include:   Sore throat.   Tiredness (fatigue).   Low-grade fever.   Headache.  Joint pain and muscle aches.  Skin rashes.  Swollen lymph nodes.  Plaque-like film on throat or tonsils (often seen with bacterial pharyngitis). DIAGNOSIS  Your health care provider will ask you questions about your illness and your symptoms. Your medical history, along with a physical exam, is often all that is needed to diagnose pharyngitis. Sometimes, a rapid strep test is done. Other lab tests may also be done, depending on the suspected cause.  TREATMENT  Viral pharyngitis will usually get better in 3-4 days without the use of medicine. Bacterial pharyngitis is treated with medicines that kill germs (antibiotics).  HOME CARE INSTRUCTIONS   Drink enough water and fluids to keep your urine clear or pale yellow.   Only take over-the-counter or prescription medicines as directed by your health care provider:   If you are prescribed antibiotics, make sure you finish them even if you start to feel better.   Do not take aspirin.   Get lots of rest.   Gargle with 8 oz of salt water ( tsp of salt per  1 qt of water) as often as every 1-2 hours to soothe your throat.   Throat lozenges (if you are not at risk for choking) or sprays may be used to soothe your throat. SEEK MEDICAL CARE IF:   You have large, tender lumps in your neck.  You have a rash.  You cough up green, yellow-brown, or bloody spit. SEEK IMMEDIATE MEDICAL CARE IF:   Your neck becomes stiff.  You drool or are unable to swallow liquids.  You vomit or are unable to keep medicines or liquids down.  You have severe pain that does not go away with the use of recommended medicines.  You have trouble breathing (not caused by a stuffy nose). MAKE SURE YOU:   Understand these instructions.  Will watch your condition.  Will get help right away if you are not doing well or get worse. Document Released: 01/12/2005 Document Revised: 11/02/2012 Document Reviewed: 09/19/2012 [facility] Patient Information 2015 [place], [place]. This information is not intended to replace advice given to you by your health care provider. Make sure you discuss any questions you have with your health care provider.

## 2014-03-19 ENCOUNTER — Telehealth: Payer: Self-pay | Admitting: Emergency Medicine

## 2014-03-19 LAB — STREP A DNA PROBE: GASP: NEGATIVE

## 2014-07-05 ENCOUNTER — Emergency Department (HOSPITAL_COMMUNITY)
Admission: EM | Admit: 2014-07-05 | Discharge: 2014-07-05 | Disposition: A | Discharge status: Home / Self Care | Payer: 59 | Attending: Emergency Medicine | Admitting: Emergency Medicine

## 2014-07-05 ENCOUNTER — Encounter (HOSPITAL_COMMUNITY): Payer: Self-pay

## 2014-07-05 ENCOUNTER — Emergency Department (HOSPITAL_COMMUNITY): Payer: 59

## 2014-07-05 DIAGNOSIS — R55 Syncope and collapse: Secondary | ICD-10-CM | POA: Diagnosis present

## 2014-07-05 DIAGNOSIS — G43909 Migraine, unspecified, not intractable, without status migrainosus: Secondary | ICD-10-CM | POA: Diagnosis not present

## 2014-07-05 DIAGNOSIS — F329 Major depressive disorder, single episode, unspecified: Secondary | ICD-10-CM | POA: Insufficient documentation

## 2014-07-05 DIAGNOSIS — Z79899 Other long term (current) drug therapy: Secondary | ICD-10-CM | POA: Diagnosis not present

## 2014-07-05 DIAGNOSIS — R519 Headache, unspecified: Secondary | ICD-10-CM

## 2014-07-05 DIAGNOSIS — R51 Headache: Secondary | ICD-10-CM

## 2014-07-05 LAB — CBC
HCT: 40.6 % (ref 36.0–46.0)
HEMOGLOBIN: 13.9 g/dL (ref 12.0–15.0)
MCH: 30.7 pg (ref 26.0–34.0)
MCHC: 34.2 g/dL (ref 30.0–36.0)
MCV: 89.6 fL (ref 78.0–100.0)
Platelets: 264 10*3/uL (ref 150–400)
RBC: 4.53 MIL/uL (ref 3.87–5.11)
RDW: 12.7 % (ref 11.5–15.5)
WBC: 5.9 10*3/uL (ref 4.0–10.5)

## 2014-07-05 LAB — URINALYSIS, ROUTINE W REFLEX MICROSCOPIC
BILIRUBIN URINE: NEGATIVE
GLUCOSE, UA: NEGATIVE mg/dL
Hgb urine dipstick: NEGATIVE
KETONES UR: NEGATIVE mg/dL
LEUKOCYTES UA: NEGATIVE
NITRITE: NEGATIVE
Protein, ur: NEGATIVE mg/dL
Specific Gravity, Urine: 1.024 (ref 1.005–1.030)
UROBILINOGEN UA: 0.2 mg/dL (ref 0.0–1.0)
pH: 6 (ref 5.0–8.0)

## 2014-07-05 LAB — BASIC METABOLIC PANEL
ANION GAP: 10 (ref 5–15)
BUN: 14 mg/dL (ref 6–20)
CALCIUM: 8.9 mg/dL (ref 8.9–10.3)
CO2: 28 mmol/L (ref 22–32)
CREATININE: 0.71 mg/dL (ref 0.44–1.00)
Chloride: 103 mmol/L (ref 101–111)
GFR calc non Af Amer: >60 mL/min (ref >60)
GLUCOSE: 98 mg/dL (ref 65–99)
Potassium: 3.3 mmol/L — ABNORMAL LOW (ref 3.5–5.1)
Sodium: 141 mmol/L (ref 135–145)

## 2014-07-05 LAB — I-STAT TROPONIN, ED: Troponin i, poc: 0 ng/mL (ref 0.00–0.08)

## 2014-07-05 MED ORDER — DIPHENHYDRAMINE HCL 50 MG/ML IJ SOLN
12.5000 mg | Freq: Once | INTRAMUSCULAR | Status: AC
  Administered 2014-07-05: 12.5 mg via INTRAVENOUS
  Filled 2014-07-05: qty 1

## 2014-07-05 MED ORDER — SODIUM CHLORIDE 0.9 % IV BOLUS (SEPSIS)
1000.0000 mL | Freq: Once | INTRAVENOUS | Status: AC
  Administered 2014-07-05: 1000 mL via INTRAVENOUS

## 2014-07-05 MED ORDER — METOCLOPRAMIDE HCL 10 MG PO TABS: 10.0000 mg | ORAL_TABLET | Freq: Four times a day (QID) | ORAL | Status: DC

## 2014-07-05 MED ORDER — METOCLOPRAMIDE HCL 5 MG/ML IJ SOLN
10.0000 mg | Freq: Once | INTRAMUSCULAR | Status: AC
  Administered 2014-07-05: 10 mg via INTRAVENOUS
  Filled 2014-07-05: qty 2

## 2014-07-05 NOTE — ED Notes (Signed)
Pt brought in EMS for near syncope/dizziness.  Pt has c/o migraine x1 week and reports she woke up this morning feeling very dizzy.  She went to work where she had a near syncopal episode and had to lie down.  No LOC, SOB, pain or n/v/d.  Pt reports she just feels dizzy and her legs feel weak.

## 2014-07-05 NOTE — ED Provider Notes (Addendum)
CSN: [id number]     Arrival date & time 07/05/14  0759 History   First MD Initiated Contact with Patient 07/05/14 [phone]     Chief Complaint  Patient presents with  . Near Syncope     (Consider location/radiation/quality/duration/timing/severity/associated sxs/prior Treatment) HPI  Tammy Carrillo is a 59 y.o. female with PMH of Migraines presenting with one week of frontal throbbing headache that radiates behind right eye with associated near syncope and dizziness to room spinning. Patient states she takes Excedrin for migraines. Headache was worse last night improvement. Patient states at times she is to strain her eyes but denies visual changes, slurred speech, focal weakness. She denies any other pain. No loss of consciousness no fevers or chills no neck pain. States dizziness is sudden in onset worse with head movement and better with staying still. Patient denies any chest pain, shortness of breath, nausea, vomiting or bowel pain, back pain, urinary symptoms. Patient has been seen 3 years ago by a neurologist in [place] [facility] Dr. Karlene Einstein with unremarkable EEG with unremarkable MRI in 2010.   Past Medical History  Diagnosis Date  . Depression   . Migraines    Past Surgical History  Procedure Laterality Date  . Cholecystectomy     Family History  Problem Relation Age of Onset  . Cancer Father   . Stroke Father   . Diabetes Father   . Cancer Sister     breast   History  Substance Use Topics  . Smoking status: Never Smoker   . Smokeless tobacco: Not on file  . Alcohol Use: No   OB History    No data available     Review of Systems 10 Systems reviewed and are negative for acute change except as noted in the HPI.    Allergies  Review of patient's allergies indicates no known allergies.  Home Medications   Prior to Admission medications   Medication Sig Start Date End Date Taking? Authorizing Provider  aspirin-acetaminophen-caffeine (EXCEDRIN MIGRAINE) [phone] MG per  tablet Take 2 tablets by mouth every 8 (eight) hours as needed for headache.   Yes Historical Provider, MD  Multiple Vitamin (MULTIVITAMIN) tablet Take 1 tablet by mouth daily.   Yes Historical Provider, MD  venlafaxine XR (EFFEXOR-XR) 150 MG 24 hr capsule Take 150 mg by mouth daily.   Yes Historical Provider, MD  ipratropium (ATROVENT) 0.06 % nasal spray Place 2 sprays into both nostrils 4 (four) times daily. Patient not taking: Reported on 07/05/2014 03/17/14   Gregor Hams, MD  metoCLOPramide (REGLAN) 10 MG tablet Take 1 tablet (10 mg total) by mouth every 6 (six) hours. 07/05/14   Al Corpus, PA-C   BP 112/57 mmHg  Pulse 65  Temp(Src) 97.9 F (36.6 C) (Oral)  Resp 18  Ht 5\' 7"  (1.702 m)  Wt 190 lb (86.183 kg)  BMI 29.75 kg/m2  SpO2 96% Physical Exam  Constitutional: She appears well-developed and well-nourished. No distress.  HENT:  Head: Normocephalic and atraumatic.  Mouth/Throat: Oropharynx is clear and moist.  Eyes: Conjunctivae and EOM are normal. Pupils are equal, round, and reactive to light. Right eye exhibits no discharge. Left eye exhibits no discharge.  Neck: Normal range of motion. Neck supple.  No nuchal rigidity  Cardiovascular: Normal rate and regular rhythm.   Pulmonary/Chest: Effort normal and breath sounds normal. No respiratory distress. She has no wheezes.  Abdominal: Soft. Bowel sounds are normal. She exhibits no distension. There is no tenderness.  Neurological: She is alert.  No cranial nerve deficit. She exhibits normal muscle tone. Coordination normal.  Speech is clear and goal oriented. Peripheral visual fields intact. Strength 5/5 in upper and lower extremities. Pt reports decreased sensation to left face, arm and lower extremity. Intact rapid alternating movements, finger to nose, and heel to shin. Negative Romberg. No pronator drift. Normal gait. Pt dizziness better with visual fixation   Skin: Skin is warm and dry. She is not diaphoretic.  Nursing note  and vitals reviewed.   ED Course  Procedures (including critical care time) Labs Review Labs Reviewed  BASIC METABOLIC PANEL - Abnormal; Notable for the following:    Potassium 3.3 (*)    All other components within normal limits  CBC  URINALYSIS, ROUTINE W REFLEX MICROSCOPIC (NOT AT [facility])  I-STAT TROPOININ, ED  CBG MONITORING, ED    Imaging Review Ct Head Wo Contrast  07/05/2014   CLINICAL DATA:  Near syncope with dizziness.  EXAM: CT HEAD WITHOUT CONTRAST  TECHNIQUE: Contiguous axial images were obtained from the base of the skull through the vertex without intravenous contrast.  COMPARISON:  None.  FINDINGS: Skull and Sinuses:No fracture or destructive process.  Inflammatory mucosal thickening throughout the bilateral paranasal sinuses with retained secretions in the left sphenoid and right maxilla.  Orbits: No acute abnormality.  Brain: No evidence of acute infarction, hemorrhage, hydrocephalus, or mass lesion/mass effect.  IMPRESSION: 1. Negative intracranial imaging. 2. Chronic sinusitis.   Electronically Signed   By: Monte Fantasia M.D.   On: 07/05/2014 10:41     EKG Interpretation   Date/Time:  Thursday July 05 2014 08:03:55 EDT Ventricular Rate:  63 PR Interval:  169 QRS Duration: 105 QT Interval:  453 QTC Calculation: 464 R Axis:   54 Text Interpretation:  Sinus rhythm Low voltage, precordial leads Abnormal  R-wave progression, early transition Minimal ST elevation, lateral leads  No old tracing to compare Confirmed by [facility]  MD, Jodey Burbano [phone]) on  07/05/2014 12:17:06 PM      Meds given in ED:  Medications  sodium chloride 0.9 % bolus 1,000 mL (0 mLs Intravenous Stopped 07/05/14 1014)  metoCLOPramide (REGLAN) injection 10 mg (10 mg Intravenous Given 07/05/14 0856)  diphenhydrAMINE (BENADRYL) injection 12.5 mg (12.5 mg Intravenous Given 07/05/14 0856)    Discharge Medication List as of 07/05/2014 11:17 AM    START taking these medications   Details  metoCLOPramide  (REGLAN) 10 MG tablet Take 1 tablet (10 mg total) by mouth every 6 (six) hours., Starting 07/05/2014, Until Discontinued, Print          MDM   Final diagnoses:  Nonintractable headache   Pt with past medical history of headaches for many years with neurological workup with negative MRI and EEG. She is presenting with HA with associated lightheadedness and vertigo. Headache is currently she's had in the past with gradual onset not maximal in intensity. Dizziness acute worse with positional changes and better with visual fixation. No history of CVA or TIA. VSS. Neurological exam without deficits except for sensory discrepancy. Pt treated with Headache cocktail. Patient reported complete resolution of her headache, dizziness. Patient has no sensory discrepancy on exam. Head CT without acute abnormalities with chronic sinusitis. I doubt subarachnoid, intracranial hemorrhage, CVA, meningitis, central vertigo. Pt to treat with Reglan as well as decongestant. Patient given referral to urology and is to follow-up as well as primary care. Patient is afebrile, nontoxic, and in no acute distress. Patient is appropriate for outpatient management and is stable for discharge.  Discussed return precautions with patient. Discussed all results and patient verbalizes understanding and agrees with plan.  Case has been discussed with Dr. Canary Brim who agrees with the above plan and to discharge.    Al Corpus, PA-C 07/05/14 [place], MD 07/05/14 [place], MD 07/05/14 [phone]

## 2014-07-05 NOTE — Discharge Instructions (Signed)

## 2014-07-05 NOTE — ED Notes (Signed)
Linker MD made aware of pt.

## 2014-07-16 ENCOUNTER — Encounter: Payer: Self-pay | Admitting: *Deleted

## 2014-07-16 ENCOUNTER — Emergency Department
Admission: EM | Admit: 2014-07-16 | Discharge: 2014-07-16 | Disposition: A | Discharge status: Home / Self Care | Payer: 59 | Source: Home / Self Care | Attending: Family Medicine | Admitting: Family Medicine

## 2014-07-16 DIAGNOSIS — J320 Chronic maxillary sinusitis: Secondary | ICD-10-CM | POA: Diagnosis not present

## 2014-07-16 MED ORDER — AMOXICILLIN-POT CLAVULANATE 875-125 MG PO TABS
1.0000 | ORAL_TABLET | Freq: Two times a day (BID) | ORAL | Status: DC
Start: 2014-07-16 — End: 2014-09-02

## 2014-07-16 MED ORDER — FLUTICASONE PROPIONATE 50 MCG/ACT NA SUSP: NASAL | Status: DC

## 2014-07-16 MED ORDER — PREDNISONE 20 MG PO TABS: 20.0000 mg | ORAL_TABLET | Freq: Two times a day (BID) | ORAL | Status: DC

## 2014-07-16 NOTE — Discharge Instructions (Signed)
May take Pseudoephedrine (30mg , one or two every 4 to 6 hours) for sinus congestion.     May use Afrin nasal spray (or generic oxymetazoline) twice daily for about 5 days.  Also recommend using saline nasal spray several times daily and saline nasal irrigation (AYR is a common brand).  Use Flonase nasal spray each morning after using Afrin nasal spray and saline nasal irrigation. Try warm salt water gargles for sore throat.  Stop all antihistamines for now, and other non-prescription cough/cold preparations.   Sinusitis Sinusitis is redness, soreness, and inflammation of the paranasal sinuses. Paranasal sinuses are air pockets within the bones of your face (beneath the eyes, the middle of the forehead, or above the eyes). In healthy paranasal sinuses, mucus is able to drain out, and air is able to circulate through them by way of your nose. However, when your paranasal sinuses are inflamed, mucus and air can become trapped. This can allow bacteria and other germs to grow and cause infection. Sinusitis can develop quickly and last only a short time (acute) or continue over a long period (chronic). Sinusitis that lasts for more than 12 weeks is considered chronic.  CAUSES  Causes of sinusitis include:  Allergies.  Structural abnormalities, such as displacement of the cartilage that separates your nostrils (deviated septum), which can decrease the air flow through your nose and sinuses and affect sinus drainage.  Functional abnormalities, such as when the small hairs (cilia) that line your sinuses and help remove mucus do not work properly or are not present. SIGNS AND SYMPTOMS  Symptoms of acute and chronic sinusitis are the same. The primary symptoms are pain and pressure around the affected sinuses. Other symptoms include:  Upper toothache.  Earache.  Headache.  Bad breath.  Decreased sense of smell and taste.  A cough, which worsens when you are lying  flat.  Fatigue.  Fever.  Thick drainage from your nose, which often is green and may contain pus (purulent).  Swelling and warmth over the affected sinuses. DIAGNOSIS  Your health care provider will perform a physical exam. During the exam, your health care provider may:  Look in your nose for signs of abnormal growths in your nostrils (nasal polyps).  Tap over the affected sinus to check for signs of infection.  View the inside of your sinuses (endoscopy) using an imaging device that has a light attached (endoscope). If your health care provider suspects that you have chronic sinusitis, one or more of the following tests may be recommended:  Allergy tests.  Nasal culture. A sample of mucus is taken from your nose, sent to a lab, and screened for bacteria.  Nasal cytology. A sample of mucus is taken from your nose and examined by your health care provider to determine if your sinusitis is related to an allergy. TREATMENT  Most cases of acute sinusitis are related to a viral infection and will resolve on their own within 10 days. Sometimes medicines are prescribed to help relieve symptoms (pain medicine, decongestants, nasal steroid sprays, or saline sprays).  However, for sinusitis related to a bacterial infection, your health care provider will prescribe antibiotic medicines. These are medicines that will help kill the bacteria causing the infection.  Rarely, sinusitis is caused by a fungal infection. In theses cases, your health care provider will prescribe antifungal medicine. For some cases of chronic sinusitis, surgery is needed. Generally, these are cases in which sinusitis recurs more than 3 times per year, despite other treatments. HOME CARE  INSTRUCTIONS   Drink plenty of water. Water helps thin the mucus so your sinuses can drain more easily.  Use a humidifier.  Inhale steam 3 to 4 times a day (for example, sit in the bathroom with the shower running).  Apply a warm,  moist washcloth to your face 3 to 4 times a day, or as directed by your health care provider.  Use saline nasal sprays to help moisten and clean your sinuses.  Take medicines only as directed by your health care provider.  If you were prescribed either an antibiotic or antifungal medicine, finish it all even if you start to feel better. SEEK IMMEDIATE MEDICAL CARE IF:  You have increasing pain or severe headaches.  You have nausea, vomiting, or drowsiness.  You have swelling around your face.  You have vision problems.  You have a stiff neck.  You have difficulty breathing. MAKE SURE YOU:   Understand these instructions.  Will watch your condition.  Will get help right away if you are not doing well or get worse. Document Released: 01/12/2005 Document Revised: 05/29/2013 Document Reviewed: 01/27/2011 [facility] Patient Information 2015 [place], [place]. This information is not intended to replace advice given to you by your health care provider. Make sure you discuss any questions you have with your health care provider.

## 2014-07-16 NOTE — ED Provider Notes (Signed)
CSN: [id number]     Arrival date & time 07/16/14  1054 History   First MD Initiated Contact with Patient 07/16/14 1154     Chief Complaint  Patient presents with  . Headache      HPI Comments: Patient complains of one month history of persistent frontal headache, right earache with occasional vertigo, and increased fatigue.  She visited ER on 07/05/14, where CT head showed only chronic sinusitis.  She received no treatment for sinusitis at that time.  Last night she developed intermittent right earache.  No fever but complains of chills.  Review of records:   CT head revealed inflammatory mucosal thickening throughout the bilateral paranasal sinuses with retained secretions in the left sphenoid and right maxilla.  The history is provided by the patient.    Past Medical History  Diagnosis Date  . Depression   . Migraines    Past Surgical History  Procedure Laterality Date  . Cholecystectomy     Family History  Problem Relation Age of Onset  . Cancer Father   . Stroke Father   . Diabetes Father   . Cancer Sister     breast  . Asthma Mother    History  Substance Use Topics  . Smoking status: Never Smoker   . Smokeless tobacco: Not on file  . Alcohol Use: No   OB History    No data available     Review of Systems No sore throat No cough No pleuritic pain No wheezing + nasal congestion + post-nasal drainage + sinus pain/pressure No itchy/red eyes ? earache No hemoptysis No SOB No fever, + chills No nausea No vomiting No abdominal pain No diarrhea No urinary symptoms No skin rash + fatigue No myalgias + headache Used OTC meds without relief  Allergies  Review of patient's allergies indicates no known allergies.  Home Medications   Prior to Admission medications   Medication Sig Start Date End Date Taking? Authorizing Provider  amoxicillin-clavulanate (AUGMENTIN) 875-125 MG per tablet Take 1 tablet by mouth 2 (two) times daily. Take with food 07/16/14    Kandra Nicolas, MD  aspirin-acetaminophen-caffeine [facility] MIGRAINE) [id number] MG per tablet Take 2 tablets by mouth every 8 (eight) hours as needed for headache.    Historical Provider, MD  fluticasone Asencion Islam) 50 MCG/ACT nasal spray Place two sprays in each nostril once daily 07/16/14   Kandra Nicolas, MD  ipratropium (ATROVENT) 0.06 % nasal spray Place 2 sprays into both nostrils 4 (four) times daily. Patient not taking: Reported on 07/05/2014 03/17/14   Gregor Hams, MD  metoCLOPramide (REGLAN) 10 MG tablet Take 1 tablet (10 mg total) by mouth every 6 (six) hours. 07/05/14   Al Corpus, PA-C  Multiple Vitamin (MULTIVITAMIN) tablet Take 1 tablet by mouth daily.    Historical Provider, MD  predniSONE (DELTASONE) 20 MG tablet Take 1 tablet (20 mg total) by mouth 2 (two) times daily. Take with food. 07/16/14   Kandra Nicolas, MD  venlafaxine XR (EFFEXOR-XR) 150 MG 24 hr capsule Take 150 mg by mouth daily.    Historical Provider, MD   BP 123/78 mmHg  Pulse 78  Temp(Src) 98.3 F (36.8 C) (Oral)  Resp 16  Ht 5\' 7"  (1.702 m)  Wt 190 lb (86.183 kg)  BMI 29.75 kg/m2  SpO2 97% Physical Exam Nursing notes and Vital Signs reviewed. Appearance:  Patient appears stated age, and in no acute distress.  Patient is obese (BMI 29.8) Eyes:  Pupils are equal,  round, and reactive to light and accomodation.  Extraocular movement is intact.  Conjunctivae are not inflamed  Ears:  Canals normal.  Tympanic membranes normal.  Nose:  Congested turbinates.  Maxillary sinus tenderness is present.  Pharynx:  Normal Neck:  Supple.  Mildly tender enlarged posterior nodes are palpated bilaterally  Lungs:  Clear to auscultation.  Breath sounds are equal.  Heart:  Regular rate and rhythm without murmurs, rubs, or gallops.  Abdomen:  Nontender  Extremities:  No edema.   Skin:  No rash present.   ED Course  Procedures  none   MDM   1. Maxillary sinusitis, chronic    Begin Augmentin 875mg  BID for two weeks.   Begin prednisone burst and Flonase nasal spray. May take Pseudoephedrine (30mg , one or two every 4 to 6 hours) for sinus congestion.     May use Afrin nasal spray (or generic oxymetazoline) twice daily for about 5 days.  Also recommend using saline nasal spray several times daily and saline nasal irrigation (AYR is a common brand).  Use Flonase nasal spray each morning after using Afrin nasal spray and saline nasal irrigation. Try warm salt water gargles for sore throat.  Stop all antihistamines for now, and other non-prescription cough/cold preparations. Followup with ENT if not improved two weeks.    Kandra Nicolas, MD 07/19/14 [phone]

## 2014-07-16 NOTE — ED Notes (Signed)
Pt c/o sinus HA, RT ear ache with Vertigo, and increased sleepiness x 1 mth. She went to the ED 07/05/14 with negative Head CT, showed chronic sinusitis. No ABT given.

## 2014-09-02 ENCOUNTER — Encounter: Payer: Self-pay | Admitting: Emergency Medicine

## 2014-09-02 ENCOUNTER — Emergency Department
Admission: EM | Admit: 2014-09-02 | Discharge: 2014-09-02 | Disposition: A | Discharge status: Home / Self Care | Payer: Commercial Managed Care - HMO | Source: Home / Self Care | Attending: Family Medicine | Admitting: Family Medicine

## 2014-09-02 DIAGNOSIS — J069 Acute upper respiratory infection, unspecified: Secondary | ICD-10-CM | POA: Diagnosis not present

## 2014-09-02 DIAGNOSIS — B9789 Other viral agents as the cause of diseases classified elsewhere: Principal | ICD-10-CM

## 2014-09-02 DIAGNOSIS — L03115 Cellulitis of right lower limb: Secondary | ICD-10-CM

## 2014-09-02 LAB — POCT RAPID STREP A (OFFICE): RAPID STREP A SCREEN: NEGATIVE

## 2014-09-02 MED ORDER — DOXYCYCLINE HYCLATE 100 MG PO CAPS: 100.0000 mg | ORAL_CAPSULE | Freq: Two times a day (BID) | ORAL | Status: DC

## 2014-09-02 MED ORDER — BENZONATATE 200 MG PO CAPS: 200.0000 mg | ORAL_CAPSULE | Freq: Every day | ORAL | Status: DC

## 2014-09-02 NOTE — ED Notes (Signed)
Pt c/o sore throat for 3 days, non productive cough, chills, low grade fever today.  Some runny nose.

## 2014-09-02 NOTE — ED Provider Notes (Signed)
CSN: [id number]     Arrival date & time 09/02/14  1220 History   First MD Initiated Contact with Patient 09/02/14 1255     Chief Complaint  Patient presents with  . Sore Throat     HPI Comments: Patient developed sore throat 3 days ago, and a non-productive cough yesterday, but no nasal congestion.  She has had fatigue, chills, and nausea without vomiting. Two days ago she noticed a tender erythematous area on her right upper inner thigh, but recalls no insect bite or other possible cause.                            The history is provided by the patient.    Past Medical History  Diagnosis Date  . Depression   . Migraines    Past Surgical History  Procedure Laterality Date  . Cholecystectomy     Family History  Problem Relation Age of Onset  . Cancer Father   . Stroke Father   . Diabetes Father   . Cancer Sister     breast  . Asthma Mother    History  Substance Use Topics  . Smoking status: Never Smoker   . Smokeless tobacco: Not on file  . Alcohol Use: No   OB History    No data available     Review of Systems + sore throat + cough No pleuritic pain No wheezing No nasal congestion No post-nasal drainage No sinus pain/pressure No itchy/red eyes No earache + dizzy No hemoptysis No SOB No fever, + chills + nausea No vomiting No abdominal pain No diarrhea No urinary symptoms No skin rash, but has lesion right inner thigh + fatigue No myalgias No headache Used OTC meds without relief  Allergies  Review of patient's allergies indicates no known allergies.  Home Medications   Prior to Admission medications   Medication Sig Start Date End Date Taking? Authorizing Provider  benzonatate (TESSALON) 200 MG capsule Take 1 capsule (200 mg total) by mouth at bedtime. Take as needed for cough 09/02/14   Kandra Nicolas, MD  doxycycline (VIBRAMYCIN) 100 MG capsule Take 1 capsule (100 mg total) by mouth 2 (two) times daily. Take with food. 09/02/14   Kandra Nicolas,  MD  venlafaxine XR (EFFEXOR-XR) 150 MG 24 hr capsule Take 150 mg by mouth daily.    Historical Provider, MD   BP 136/98 mmHg  Pulse 70  Temp(Src) 98.1 F (36.7 C) (Oral)  Ht 5\' 7"  (1.702 m)  Wt 189 lb (85.73 kg)  BMI 29.59 kg/m2  SpO2 95% Physical Exam  Constitutional: She is oriented to person, place, and time. She appears well-developed and well-nourished. No distress.  HENT:  Head: Normocephalic.  Right Ear: Tympanic membrane normal.  Left Ear: Tympanic membrane normal.  Nose: Nose normal.  Mouth/Throat: Oropharynx is clear and moist.  Eyes: Conjunctivae are normal. Pupils are equal, round, and reactive to light.  Neck: Neck supple.  Tender enlarged posterior nodes are palpated bilaterally  Cardiovascular: Normal heart sounds.   Pulmonary/Chest: Breath sounds normal. She exhibits tenderness.  Chest:  Distinct tenderness to palpation over the mid-sternum.   Abdominal: There is no tenderness.  Musculoskeletal: She exhibits edema.  Lymphadenopathy:    She has cervical adenopathy.  Neurological: She is alert and oriented to person, place, and time.  Skin: Skin is warm and dry.     On the upper inner thigh is a 3cm diameter area  of erythema with central excoriation.  The area is tender to palpation but no induration or fluctuance.  Nursing note and vitals reviewed.   ED Course  Procedures  None    Labs Reviewed  POCT RAPID STREP A (OFFICE) negative      MDM   1. Viral URI with cough   2. Cellulitis of leg, right    Begin doxycycline for staph coverage. Prescription written for Benzonatate [facility]) to take at bedtime for night-time cough.  Keep wound right leg bandaged until healed, and change daily. Take plain guaifenesin (1200mg  extended release tabs such as Mucinex) twice daily, with plenty of water, for cough and congestion.  May add Pseudoephedrine (30mg , one or two every 4 to 6 hours) for sinus congestion.  Get adequate rest.   May use Afrin nasal spray  (or generic oxymetazoline) twice daily for about 5 days.  Also recommend using saline nasal spray several times daily and saline nasal irrigation (AYR is a common brand).  Use Flonase nasal spray each morning after using Afrin nasal spray and saline nasal irrigation. Try warm salt water gargles for sore throat.  May use albuterol inhaler as needed for wheezing. Stop all antihistamines for now, and other non-prescription cough/cold preparations. May take Ibuprofen 200mg , 4 tabs every 8 hours with food for chest/sternum discomfort.   Follow-up with family doctor if not improving about 7 to10 days.     Kandra Nicolas, MD 09/02/14 [phone]

## 2014-09-02 NOTE — Discharge Instructions (Signed)
Keep wound right leg bandaged until healed, and change daily. Take plain guaifenesin (1200mg  extended release tabs such as Mucinex) twice daily, with plenty of water, for cough and congestion.  May add Pseudoephedrine (30mg , one or two every 4 to 6 hours) for sinus congestion.  Get adequate rest.   May use Afrin nasal spray (or generic oxymetazoline) twice daily for about 5 days.  Also recommend using saline nasal spray several times daily and saline nasal irrigation (AYR is a common brand).  Use Flonase nasal spray each morning after using Afrin nasal spray and saline nasal irrigation. Try warm salt water gargles for sore throat.  May use albuterol inhaler as needed for wheezing. Stop all antihistamines for now, and other non-prescription cough/cold preparations. May take Ibuprofen 200mg , 4 tabs every 8 hours with food for chest/sternum discomfort.   Follow-up with family doctor if not improving about 7 to10 days.

## 2014-10-14 ENCOUNTER — Encounter: Payer: Self-pay | Admitting: Emergency Medicine

## 2014-10-14 ENCOUNTER — Emergency Department
Admission: EM | Admit: 2014-10-14 | Discharge: 2014-10-14 | Disposition: A | Discharge status: Home / Self Care | Payer: Commercial Managed Care - HMO | Source: Home / Self Care | Attending: Family Medicine | Admitting: Family Medicine

## 2014-10-14 DIAGNOSIS — J029 Acute pharyngitis, unspecified: Secondary | ICD-10-CM

## 2014-10-14 LAB — POCT RAPID STREP A (OFFICE): Rapid Strep A Screen: NEGATIVE

## 2014-10-14 NOTE — ED Notes (Signed)
Pt c/o sore throat for 2 days.  No congestion.  No better with salt water gargles.

## 2014-10-14 NOTE — Discharge Instructions (Signed)

## 2014-10-14 NOTE — ED Provider Notes (Signed)
CSN: [id number]     Arrival date & time 10/14/14  1101 History   First MD Initiated Contact with Patient 10/14/14 1128     Chief Complaint  Patient presents with  . Sore Throat   (Consider location/radiation/quality/duration/timing/severity/associated sxs/prior Treatment) HPI  Pt is a 59yo female presenting to [facility] with c/o gradually worsening sore throat with redness for 2 days. She has tried salt water gargles w/o relief. 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 Denies fever, chills, n/v/d. Denies recent travel. Denies difficulty breathing or swallowing.  Past Medical History  Diagnosis Date  . Depression   . Migraines    Past Surgical History  Procedure Laterality Date  . Cholecystectomy     Family History  Problem Relation Age of Onset  . Cancer Father   . Stroke Father   . Diabetes Father   . Cancer Sister     breast  . Asthma Mother    Social History  Substance Use Topics  . Smoking status: Never Smoker   . Smokeless tobacco: None  . Alcohol Use: No   OB History    No data available     Review of Systems  Constitutional: Negative for fever and chills.  HENT: Positive for sore throat. Negative for congestion, ear pain, trouble swallowing and voice change.   Respiratory: Negative for cough and shortness of breath.   Cardiovascular: Negative for chest pain and palpitations.  Gastrointestinal: Negative for nausea, vomiting, abdominal pain and diarrhea.  Musculoskeletal: Negative for myalgias, back pain and arthralgias.  Skin: Negative for rash.  All other systems reviewed and are negative.   Allergies  Review of patient's allergies indicates no known allergies.  Home Medications   Prior to Admission medications   Medication Sig Start Date End Date Taking? Authorizing Provider  venlafaxine XR (EFFEXOR-XR) 150 MG 24 hr capsule Take 150 mg by mouth daily.    Historical Provider, MD   Meds Ordered and Administered this  Visit  Medications - No data to display  BP 151/84 mmHg  Pulse 77  Temp(Src) 98.3 F (36.8 C) (Oral)  Ht 5\' 7"  (1.702 m)  Wt 190 lb (86.183 kg)  BMI 29.75 kg/m2  SpO2 96% No data found.   Physical Exam  Constitutional: She appears well-developed and well-nourished. No distress.  HENT:  Head: Normocephalic and atraumatic.  Right Ear: Hearing, tympanic membrane, external ear and ear canal normal.  Left Ear: Hearing, tympanic membrane, external ear and ear canal normal.  Nose: Nose normal.  Mouth/Throat: Uvula is midline and mucous membranes are normal. No uvula swelling ( erythematous). Posterior oropharyngeal erythema present. No oropharyngeal exudate, posterior oropharyngeal edema or tonsillar abscesses.  Eyes: Conjunctivae are normal. No scleral icterus.  Neck: Normal range of motion. Neck supple.  Cardiovascular: Normal rate, regular rhythm and normal heart sounds.   Pulmonary/Chest: Effort normal and breath sounds normal. No respiratory distress. She has no wheezes. She has no rales. She exhibits no tenderness.  Abdominal: Soft. Bowel sounds are normal. She exhibits no distension and no mass. There is no tenderness. There is no rebound and no guarding.  Musculoskeletal: Normal range of motion.  Lymphadenopathy:    She has no cervical adenopathy.  Neurological: She is alert.  Skin: Skin is warm and dry. She is not diaphoretic.  Nursing note and vitals reviewed.   ED Course  Procedures (including critical care time)  Labs Review Labs Reviewed  STREP A DNA PROBE  POCT RAPID STREP A (OFFICE)    Imaging Review No results found.   MDM   1. Acute pharyngitis, unspecified pharyngitis type     Pt c/o sore throat. No peritonsillar abscess. Rapid strep: negative. Will tx symptomatically, likely viral in nature. Culture sent. Advised pt to use acetaminophen and ibuprofen as needed for fever and pain. Encouraged rest and fluids. Offered work note, pt stated she cannot  afford any time off right now. Encouraged to f/u with PCP in 1 week if not improving, sooner if worsening.  Pt verbalized understanding and agreement with tx plan.     Noland Fordyce, PA-C 10/14/14 1149

## 2014-10-15 ENCOUNTER — Telehealth: Payer: Self-pay | Admitting: Emergency Medicine

## 2014-10-15 LAB — STREP A DNA PROBE: GASP: NEGATIVE

## 2014-10-18 ENCOUNTER — Emergency Department
Admission: EM | Admit: 2014-10-18 | Discharge: 2014-10-18 | Disposition: A | Discharge status: Home / Self Care | Payer: Commercial Managed Care - HMO | Source: Home / Self Care | Attending: Family Medicine | Admitting: Family Medicine

## 2014-10-18 ENCOUNTER — Encounter: Payer: Self-pay | Admitting: Emergency Medicine

## 2014-10-18 DIAGNOSIS — B9789 Other viral agents as the cause of diseases classified elsewhere: Secondary | ICD-10-CM

## 2014-10-18 DIAGNOSIS — J029 Acute pharyngitis, unspecified: Secondary | ICD-10-CM

## 2014-10-18 DIAGNOSIS — J069 Acute upper respiratory infection, unspecified: Secondary | ICD-10-CM

## 2014-10-18 MED ORDER — BENZONATATE 200 MG PO CAPS: 200.0000 mg | ORAL_CAPSULE | Freq: Every day | ORAL | Status: DC

## 2014-10-18 MED ORDER — AMOXICILLIN 875 MG PO TABS: 875.0000 mg | ORAL_TABLET | Freq: Two times a day (BID) | ORAL | Status: DC

## 2014-10-18 NOTE — ED Notes (Signed)
Cough, sore throat, headache x 1 week, was seen here on Sunday, neg strep

## 2014-10-18 NOTE — Discharge Instructions (Signed)
Take plain guaifenesin (1200mg  extended release tabs such as Mucinex) twice daily, with plenty of water, for cough and congestion.  May add Pseudoephedrine (30mg , one or two every 4 to 6 hours) for sinus congestion.  Get adequate rest.   May use Afrin nasal spray (or generic oxymetazoline) twice daily for about 5 days and then discontinue.  Also recommend using saline nasal spray several times daily and saline nasal irrigation (AYR is a common brand).  Use Flonase nasal spray each morning after using Afrin nasal spray and saline nasal irrigation. Try warm salt water gargles for sore throat.  Stop all antihistamines for now, and other non-prescription cough/cold preparations. May take Ibuprofen 200mg , 4 tabs every 8 hours with food for sore throat, headache, etc. Begin Amoxicillin if not improving about five days or if persistent fever develops   Follow-up with family doctor if not improving about 1 week   Salt Water Gargle This solution will help make your mouth and throat feel better. HOME CARE INSTRUCTIONS   Mix 1 teaspoon of salt in 8 ounces of warm water.  Gargle with this solution as much or often as you need or as directed. Swish and gargle gently if you have any sores or wounds in your mouth.  Do not swallow this mixture. Document Released: 10/17/2003 Document Revised: 04/06/2011 Document Reviewed: 03/09/2008 [facility] Patient Information 2015 [place], [place]. This information is not intended to replace advice given to you by your health care provider. Make sure you discuss any questions you have with your health care provider.

## 2014-10-18 NOTE — ED Provider Notes (Signed)
CSN: [id number]     Arrival date & time 10/18/14  1413 History   First MD Initiated Contact with Patient 10/18/14 1425     Chief Complaint  Patient presents with  . Cough      HPI Comments: Patient returns, having been seen 4 days ago for sore throat, now complaining of onset of a non-productive cough worse at night.  No pleuritic pain or shortness of breath.  She has had chills/sweats and remains fatigued.  The history is provided by the patient.    Past Medical History  Diagnosis Date  . Depression   . Migraines    Past Surgical History  Procedure Laterality Date  . Cholecystectomy     Family History  Problem Relation Age of Onset  . Cancer Father   . Stroke Father   . Diabetes Father   . Cancer Sister     breast  . Asthma Mother    Social History  Substance Use Topics  . Smoking status: Never Smoker   . Smokeless tobacco: None  . Alcohol Use: No   OB History    No data available     Review of Systems + sore throat + hoarse + cough No pleuritic pain No wheezing + nasal congestion + post-nasal drainage No sinus pain/pressure No itchy/red eyes No earache No hemoptysis No SOB No fever, + chills/sweats No nausea No vomiting No abdominal pain No diarrhea No urinary symptoms No skin rash + fatigue No myalgias + headache Used OTC meds without relief  Allergies  Review of patient's allergies indicates not on file.  Home Medications   Prior to Admission medications   Medication Sig Start Date End Date Taking? Authorizing Provider  benzonatate (TESSALON) 200 MG capsule Take 1 capsule (200 mg total) by mouth at bedtime. Take as needed for cough 10/18/14   Kandra Nicolas, MD  venlafaxine XR (EFFEXOR-XR) 150 MG 24 hr capsule Take 150 mg by mouth daily.    Historical Provider, MD   Meds Ordered and Administered this Visit  Medications - No data to display  BP 141/85 mmHg  Pulse 68  Temp(Src) 98.5 F (36.9 C) (Oral)  Ht 5\' 7"  (1.702 m)  Wt 189 lb  (85.73 kg)  BMI 29.59 kg/m2  SpO2 98% No data found.   Physical Exam Nursing notes and Vital Signs reviewed. Appearance:  Patient appears stated age, and in no acute distress Eyes:  Pupils are equal, round, and reactive to light and accomodation.  Extraocular movement is intact.  Conjunctivae are not inflamed  Ears:  Canals normal.  Tympanic membranes normal.  Nose:  Congested turbinates.  No sinus tenderness.   Pharynx:   Mild erythema uvula Neck:  Supple.  Tender enlarged posterior nodes are palpated bilaterally  Lungs:  Clear to auscultation.  Breath sounds are equal.  Moving air well. Heart:  Regular rate and rhythm without murmurs, rubs, or gallops.  Abdomen:  Nontender without masses or hepatosplenomegaly.  Bowel sounds are present.  No CVA or flank tenderness.  Extremities:  No edema.  No calf tenderness Skin:  No rash present.   ED Course  Procedures  none   MDM   1. Acute pharyngitis, unspecified pharyngitis type   2. Viral URI with cough    There is no evidence of bacterial infection today.  Prescription written for Benzonatate [facility]) to take at bedtime for night-time cough.   Take plain guaifenesin (1200mg  extended release tabs such as Mucinex) twice daily, with plenty of  water, for cough and congestion.  May add Pseudoephedrine (30mg , one or two every 4 to 6 hours) for sinus congestion.  Get adequate rest.   May use Afrin nasal spray (or generic oxymetazoline) twice daily for about 5 days and then discontinue.  Also recommend using saline nasal spray several times daily and saline nasal irrigation (AYR is a common brand).  Use Flonase nasal spray each morning after using Afrin nasal spray and saline nasal irrigation. Try warm salt water gargles for sore throat.  Stop all antihistamines for now, and other non-prescription cough/cold preparations. May take Ibuprofen 200mg , 4 tabs every 8 hours with food for sore throat, headache, etc. Begin Amoxicillin if not improving  about five days or if persistent fever develops (Given a prescription to hold, with an expiration date)  Follow-up with family doctor if not improving about 1 week    Kandra Nicolas, MD 10/18/14 1510

## 2014-10-23 ENCOUNTER — Telehealth: Payer: Self-pay | Admitting: *Deleted

## 2015-02-06 ENCOUNTER — Emergency Department (INDEPENDENT_AMBULATORY_CARE_PROVIDER_SITE_OTHER)
Admission: EM | Admit: 2015-02-06 | Discharge: 2015-02-06 | Disposition: A | Discharge status: Home / Self Care | Payer: BC Managed Care – PPO | Source: Home / Self Care | Attending: Family Medicine | Admitting: Family Medicine

## 2015-02-06 ENCOUNTER — Encounter: Payer: Self-pay | Admitting: *Deleted

## 2015-02-06 DIAGNOSIS — R69 Illness, unspecified: Principal | ICD-10-CM

## 2015-02-06 DIAGNOSIS — J111 Influenza due to unidentified influenza virus with other respiratory manifestations: Secondary | ICD-10-CM

## 2015-02-06 DIAGNOSIS — J9801 Acute bronchospasm: Secondary | ICD-10-CM | POA: Diagnosis not present

## 2015-02-06 MED ORDER — PREDNISONE 20 MG PO TABS: 20.0000 mg | ORAL_TABLET | Freq: Two times a day (BID) | ORAL | Status: DC

## 2015-02-06 NOTE — Discharge Instructions (Signed)
Take plain guaifenesin (1200mg  extended release tabs such as Mucinex) twice daily, with plenty of water, for cough and congestion.  May add Pseudoephedrine (30mg , one or two every 4 to 6 hours) for sinus congestion.  Get adequate rest.    Also recommend using saline nasal spray several times daily and saline nasal irrigation (AYR is a common brand).  Continue Flonase Continue albuterol inhaler as needed.  May continue Tessalon (benzonatate) at bedtime. Follow-up with family doctor if not improving about one week or if fever develops.  Recommend a flu shot when well.

## 2015-02-06 NOTE — ED Provider Notes (Signed)
CSN: [id number]     Arrival date & time 02/06/15  [id number] History   First MD Initiated Contact with Patient 02/06/15 [phone]     Chief Complaint  Patient presents with  . Generalized Body Aches  . Cough  . Diarrhea      HPI Comments: Five days ago patient developed flu-like illness including myalgias, headache, chills, fatigue, and cough.  She also has mild nasal congestion but no sore throat.  Cough is now productive and somewhat worse at night.  She complains of tightness in her anterior chest, and has developed mild wheezing and shortness of breath with activity.  She has not had a flu shot this season.  She has had mild nausea without vomiting, and has developed loose stools today.  She states that she now feels much better after her chills have resolved and her cough has become productive.  She has an albuterol inhaler which has been helpful when she wheezes. She has a family history of asthma in her mother.                             The history is provided by the patient.    Past Medical History  Diagnosis Date  . Depression   . Migraines    Past Surgical History  Procedure Laterality Date  . Cholecystectomy     Family History  Problem Relation Age of Onset  . Cancer Father   . Stroke Father   . Diabetes Father   . Cancer Sister     breast  . Asthma Mother    Social History  Substance Use Topics  . Smoking status: Never Smoker   . Smokeless tobacco: Never Used  . Alcohol Use: No   OB History    No data available     Review of Systems No sore throat + cough No pleuritic pain + wheezing + nasal congestion + post-nasal drainage No sinus pain/pressure No itchy/red eyes ? earache No hemoptysis + SOB with activity No fever, + chills + nausea No vomiting No abdominal pain + diarrhea No urinary symptoms No skin rash + fatigue + myalgias + headache Used OTC meds without relief  Allergies  Review of patient's allergies indicates no known allergies.  Home  Medications   Prior to Admission medications   Medication Sig Start Date End Date Taking? Authorizing Provider  predniSONE (DELTASONE) 20 MG tablet Take 1 tablet (20 mg total) by mouth 2 (two) times daily. Take with food. 02/06/15   Kandra Nicolas, MD  venlafaxine XR (EFFEXOR-XR) 150 MG 24 hr capsule Take 150 mg by mouth daily.    Historical Provider, MD   Meds Ordered and Administered this Visit  Medications - No data to display  BP 118/75 mmHg  Pulse 95  Temp(Src) 98.5 F (36.9 C) (Oral)  Resp 16  Wt 189 lb (85.73 kg)  SpO2 99% No data found.   Physical Exam Nursing notes and Vital Signs reviewed. Appearance:  Patient appears stated age, and in no acute distress Eyes:  Pupils are equal, round, and reactive to light and accomodation.  Extraocular movement is intact.  Conjunctivae are not inflamed  Ears:  Canals normal.  Tympanic membranes normal.  Nose:  Congested turbinates.  No sinus tenderness.   Pharynx:  Normal Neck:  Supple.  Nontender enlarged posterior nodes are palpated bilaterally  Lungs:  Faint bibasilar wheezes posteriorly.  Breath sounds are equal.  Moving air well.  Chest:  Distinct tenderness to palpation over the mid-sternum.  Heart:  Regular rate and rhythm without murmurs, rubs, or gallops.  Abdomen:  Nontender without masses or hepatosplenomegaly.  Bowel sounds are present.  No CVA or flank tenderness.  Extremities:  No edema.  Skin:  No rash present.   ED Course  Procedures none    MDM   1. Influenza-like illness; improving  2. Bronchospasm     There is no evidence of bacterial infection today.   Begin prednisone burst. Take plain guaifenesin (1200mg  extended release tabs such as Mucinex) twice daily, with plenty of water, for cough and congestion.  May add Pseudoephedrine (30mg , one or two every 4 to 6 hours) for sinus congestion.  Get adequate rest.    Also recommend using saline nasal spray several times daily and saline nasal irrigation (AYR is a  common brand).  Continue Flonase Continue albuterol inhaler as needed.  May continue Tessalon (benzonatate) at bedtime. Recommend clear liquids with Pedialyte today while having loose stools, then advance to a BRAT diet when improved. Follow-up with family doctor if not improving about one week or if fever develops.  Recommend a flu shot when well.     Kandra Nicolas, MD 02/06/15 [phone]

## 2015-02-06 NOTE — ED Notes (Signed)
Pt c/o 5 days od aches, chills, HA, runny nose and cough. Cough became productive today.

## 2015-02-08 ENCOUNTER — Telehealth: Payer: Self-pay | Admitting: *Deleted

## 2015-02-13 ENCOUNTER — Encounter: Payer: Self-pay | Admitting: *Deleted

## 2015-02-13 ENCOUNTER — Emergency Department (INDEPENDENT_AMBULATORY_CARE_PROVIDER_SITE_OTHER): Payer: BC Managed Care – PPO

## 2015-02-13 ENCOUNTER — Emergency Department (INDEPENDENT_AMBULATORY_CARE_PROVIDER_SITE_OTHER)
Admission: EM | Admit: 2015-02-13 | Discharge: 2015-02-13 | Disposition: A | Discharge status: Home / Self Care | Payer: BC Managed Care – PPO | Source: Home / Self Care | Attending: Family Medicine | Admitting: Family Medicine

## 2015-02-13 DIAGNOSIS — J4521 Mild intermittent asthma with (acute) exacerbation: Secondary | ICD-10-CM

## 2015-02-13 DIAGNOSIS — R062 Wheezing: Secondary | ICD-10-CM

## 2015-02-13 DIAGNOSIS — R059 Cough, unspecified: Secondary | ICD-10-CM

## 2015-02-13 DIAGNOSIS — R51 Headache: Secondary | ICD-10-CM

## 2015-02-13 DIAGNOSIS — J069 Acute upper respiratory infection, unspecified: Secondary | ICD-10-CM

## 2015-02-13 DIAGNOSIS — R05 Cough: Secondary | ICD-10-CM

## 2015-02-13 MED ORDER — IPRATROPIUM-ALBUTEROL 0.5-2.5 (3) MG/3ML IN SOLN: 3.0000 mL | Freq: Four times a day (QID) | RESPIRATORY_TRACT | Status: DC

## 2015-02-13 MED ORDER — DOXYCYCLINE HYCLATE 100 MG PO CAPS: 100.0000 mg | ORAL_CAPSULE | Freq: Two times a day (BID) | ORAL | Status: DC

## 2015-02-13 MED ORDER — IPRATROPIUM-ALBUTEROL 0.5-2.5 (3) MG/3ML IN SOLN
3.0000 mL | Freq: Once | RESPIRATORY_TRACT | Status: AC
  Administered 2015-02-13: 3 mL via RESPIRATORY_TRACT

## 2015-02-13 MED ORDER — ALBUTEROL SULFATE (2.5 MG/3ML) 0.083% IN NEBU: 2.5000 mg | INHALATION_SOLUTION | Freq: Four times a day (QID) | RESPIRATORY_TRACT | Status: DC | PRN

## 2015-02-13 NOTE — ED Provider Notes (Signed)
CSN: [id number]     Arrival date & time 02/13/15  1645 History   First MD Initiated Contact with Patient 02/13/15 1701     Chief Complaint  Patient presents with  . Cough  . Night Sweats  . Sore Throat   (Consider location/radiation/quality/duration/timing/severity/associated sxs/prior Treatment) HPI  Pt is a 60yo female with hx of asthma presenting to [facility] with c/o worsening cough, congestion, shortness of breath, sore throat and generalized weakness. She was seen at [facility] on 02/06/15 and had been sick for 5 days at that time. She was dx with a influenza-like illness and started on albuterol, prednisone, and tessalon. Since then, symptoms have only worsened. Cough is worse at night, occasional blood tinged sputum.  Pt c/o chest soreness from coughing so much.  She has used her inhaler and took the prednisone for 5 days but states "nothing is helping." her husband is also sick with a bad cough at home.  She does have a nebulizer machine at home but does not like to use it due to making her heart race.  She has had nausea but denies vomiting or diarrhea.  Past Medical History  Diagnosis Date  . Depression   . Migraines    Past Surgical History  Procedure Laterality Date  . Cholecystectomy     Family History  Problem Relation Age of Onset  . Cancer Father   . Stroke Father   . Diabetes Father   . Cancer Sister     breast  . Asthma Mother    Social History  Substance Use Topics  . Smoking status: Never Smoker   . Smokeless tobacco: Never Used  . Alcohol Use: No   OB History    No data available     Review of Systems  Constitutional: Positive for diaphoresis. Negative for fever and chills.  HENT: Positive for congestion and sore throat. Negative for ear pain, trouble swallowing and voice change.   Respiratory: Positive for cough, chest tightness and wheezing. Negative for shortness of breath.   Cardiovascular: Negative for chest pain and palpitations.  Gastrointestinal: Positive  for nausea. Negative for vomiting, abdominal pain and diarrhea.  Musculoskeletal: Positive for myalgias and arthralgias. Negative for back pain.  Skin: Negative for rash.  Neurological: Positive for headaches. Negative for dizziness and light-headedness.    Allergies  Review of patient's allergies indicates no known allergies.  Home Medications   Prior to Admission medications   Medication Sig Start Date End Date Taking? Authorizing Provider  albuterol (PROVENTIL) (2.5 MG/3ML) 0.083% nebulizer solution Take 3 mLs (2.5 mg total) by nebulization every 6 (six) hours as needed for wheezing or shortness of breath. 02/13/15   Noland Fordyce, PA-C  doxycycline (VIBRAMYCIN) 100 MG capsule Take 1 capsule (100 mg total) by mouth 2 (two) times daily. One po bid x 7 days 02/13/15   Noland Fordyce, PA-C  venlafaxine XR (EFFEXOR-XR) 150 MG 24 hr capsule Take 150 mg by mouth daily.    Historical Provider, MD   Meds Ordered and Administered this Visit   Medications  ipratropium-albuterol (DUONEB) 0.5-2.5 (3) MG/3ML nebulizer solution 3 mL (3 mLs Nebulization Given 02/13/15 1735)    BP 128/79 mmHg  Pulse 69  Temp(Src) 97.8 F (36.6 C) (Oral)  Resp 16  SpO2 98% No data found.   Physical Exam  Constitutional: She appears well-developed and well-nourished. No distress.  HENT:  Head: Normocephalic and atraumatic.  Right Ear: Hearing, tympanic membrane, external ear and ear canal normal.  Left Ear:  Hearing, tympanic membrane, external ear and ear canal normal.  Nose: Nose normal.  Mouth/Throat: Uvula is midline and mucous membranes are normal. Posterior oropharyngeal erythema present. No oropharyngeal exudate, posterior oropharyngeal edema or tonsillar abscesses.  Eyes: Conjunctivae are normal. No scleral icterus.  Neck: Normal range of motion. Neck supple.  Cardiovascular: Normal rate, regular rhythm and normal heart sounds.   Pulmonary/Chest: Effort normal. No respiratory distress. She has wheezes.  She has rhonchi. She has no rales. She exhibits no tenderness.  Diffuse inspiratory and expiratory wheeze with faint rhonchi. No respiratory distress. Able to speak in full sentences. Intermittent dry cough on exam.  Abdominal: Soft. She exhibits no distension and no mass. There is no tenderness. There is no rebound and no guarding.  Musculoskeletal: Normal range of motion.  Neurological: She is alert.  Skin: Skin is warm and dry. She is not diaphoretic.  Nursing note and vitals reviewed.   ED Course  Procedures (including critical care time)  Labs Review Labs Reviewed - No data to display  Imaging Review Dg Chest 2 View  02/13/2015  CLINICAL DATA:  Productive cough, headache, nausea. EXAM: CHEST  2 VIEW COMPARISON:  None. FINDINGS: The heart size and mediastinal contours are within normal limits. Both lungs are clear. Mild degenerative change noted within the thoracic spine. Cholecystectomy clips within the right upper quadrant. IMPRESSION: Lungs are clear and there is no evidence of acute cardiopulmonary abnormality. No evidence of pneumonia. Electronically Signed   By: Franki Cabot M.D.   On: 02/13/2015 17:21      MDM   1. Acute upper respiratory infection   2. Cough   3. Wheeze   4. Asthma with acute exacerbation, mild intermittent     Pt c/o worsening URI symptoms for almost 2 weeks. Husband also sick at home. Diffuse inspiratory and expiratory wheeze on exam.  Rx: no evidence of pneumonia.    Tx in UC: duoneb treatment. Pt states she feels moderately better.  Lungs sounds much improved, faint expiratory wheeze in Right lung fields still present.  Will start pt on doxycycline to cover atypical bacteria as pt states Azithromycin does not help her.   Encouraged to use albuterol inhaler and/or nebulizer machine prior to going to bed if she is coughing as this may help decrease coughing at night. May also try humidifier or steam shower before bed.  Advised pt to use  acetaminophen and ibuprofen as needed for fever and pain. Encouraged rest and fluids. F/u with PCP in 1 week if not improving, sooner if worsening. Pt verbalized understanding and agreement with tx plan.    Noland Fordyce, PA-C 02/13/15 1757

## 2015-02-13 NOTE — Discharge Instructions (Signed)
You may take 400-600mg  Ibuprofen (Motrin) every 6-8 hours for fever and pain  Alternate with Tylenol  You may take 500mg  Tylenol every 4-6 hours as needed for fever and pain  Follow-up with your primary care provider next week for recheck of symptoms if not improving.  Be sure to drink plenty of fluids and rest, at least 8hrs of sleep a night, preferably more while you are sick. Return urgent care or go to closest ER if you cannot keep down fluids/signs of dehydration, fever not reducing with Tylenol, difficulty breathing/wheezing, stiff neck, worsening condition, or other concerns (see below)  Please take antibiotics as prescribed and be sure to complete entire course even if you start to feel better to ensure infection does not come back.   Cool Mist Vaporizers Vaporizers may help relieve the symptoms of a cough and cold. They add moisture to the air, which helps mucus to become thinner and less sticky. This makes it easier to breathe and cough up secretions. Cool mist vaporizers do not cause serious burns like hot mist vaporizers, which may also be called steamers or humidifiers. Vaporizers have not been proven to help with colds. You should not use a vaporizer if you are allergic to mold. HOME CARE INSTRUCTIONS  Follow the package instructions for the vaporizer.  Do not use anything other than distilled water in the vaporizer.  Do not run the vaporizer all of the time. This can cause mold or bacteria to grow in the vaporizer.  Clean the vaporizer after each time it is used.  Clean and dry the vaporizer well before storing it.  Stop using the vaporizer if worsening respiratory symptoms develop.   This information is not intended to replace advice given to you by your health care provider. Make sure you discuss any questions you have with your health care provider.   Document Released: 10/10/2003 Document Revised: 01/17/2013 Document Reviewed: 06/01/2012 Elsevier Interactive Patient  Education 2016 [place].  Asthma, Adult Asthma is a condition of the lungs in which the airways tighten and narrow. Asthma can make it hard to breathe. Asthma cannot be cured, but medicine and lifestyle changes can help control it. Asthma may be started (triggered) by:  Animal skin flakes (dander).  Dust.  Cockroaches.  Pollen.  Mold.  Smoke.  Cleaning products.  Hair sprays or aerosol sprays.  Paint fumes or strong smells.  Cold air, weather changes, and winds.  Crying or laughing hard.  Stress.  Certain medicines or drugs.  Foods, such as dried fruit, potato chips, and sparkling grape juice.  Infections or conditions (colds, flu).  Exercise.  Certain medical conditions or diseases.  Exercise or tiring activities. HOME CARE   Take medicine as told by your doctor.  Use a peak flow meter as told by your doctor. A peak flow meter is a tool that measures how well the lungs are working.  Record and keep track of the peak flow meter's readings.  Understand and use the asthma action plan. An asthma action plan is a written plan for taking care of your asthma and treating your attacks.  To help prevent asthma attacks:  Do not smoke. Stay away from secondhand smoke.  Change your heating and air conditioning filter often.  Limit your use of fireplaces and wood stoves.  Get rid of pests (such as roaches and mice) and their droppings.  Throw away plants if you see mold on them.  Clean your floors. Dust regularly. Use cleaning products that do  not smell.  Have someone vacuum when you are not home. Use a vacuum cleaner with a HEPA filter if possible.  Replace carpet with wood, tile, or vinyl flooring. Carpet can trap animal skin flakes and dust.  Use allergy-proof pillows, mattress covers, and box spring covers.  Wash bed sheets and blankets every week in hot water and dry them in a dryer.  Use blankets that are made of polyester or cotton.  Clean  bathrooms and kitchens with bleach. If possible, have someone repaint the walls in these rooms with mold-resistant paint. Keep out of the rooms that are being cleaned and painted.  Wash hands often. GET HELP IF:  You have make a whistling sound when breaking (wheeze), have shortness of breath, or have a cough even if taking medicine to prevent attacks.  The colored mucus you cough up (sputum) is thicker than usual.  The colored mucus you cough up changes from clear or white to yellow, green, gray, or bloody.  You have problems from the medicine you are taking such as:  A rash.  Itching.  Swelling.  Trouble breathing.  You need reliever medicines more than 2-3 times a week.  Your peak flow measurement is still at 50-79% of your personal best after following the action plan for 1 hour.  You have a fever. GET HELP RIGHT AWAY IF:   You seem to be worse and are not responding to medicine during an asthma attack.  You are short of breath even at rest.  You get short of breath when doing very little activity.  You have trouble eating, drinking, or talking.  You have chest pain.  You have a fast heartbeat.  Your lips or fingernails start to turn blue.  You are light-headed, dizzy, or faint.  Your peak flow is less than 50% of your personal best.   This information is not intended to replace advice given to you by your health care provider. Make sure you discuss any questions you have with your health care provider.   Document Released: 07/01/2007 Document Revised: 10/03/2014 Document Reviewed: 08/11/2012 Elsevier Interactive Patient Education 2016 [place].  Asthma, Acute Bronchospasm Acute bronchospasm caused by asthma is also referred to as an asthma attack. Bronchospasm means your air passages become narrowed. The narrowing is caused by inflammation and tightening of the muscles in the air tubes (bronchi) in your lungs. This can make it hard to breathe or cause you  to wheeze and cough. CAUSES Possible triggers are:  Animal dander from the skin, hair, or feathers of animals.  Dust mites contained in house dust.  Cockroaches.  Pollen from trees or grass.  Mold.  Cigarette or tobacco smoke.  Air pollutants such as dust, household cleaners, hair sprays, aerosol sprays, paint fumes, strong chemicals, or strong odors.  Cold air or weather changes. Cold air may trigger inflammation. Winds increase molds and pollens in the air.  Strong emotions such as crying or laughing hard.  Stress.  Certain medicines such as aspirin or beta-blockers.  Sulfites in foods and drinks, such as dried fruits and wine.  Infections or inflammatory conditions, such as a flu, cold, or inflammation of the nasal membranes (rhinitis).  Gastroesophageal reflux disease (GERD). GERD is a condition where stomach acid backs up into your esophagus.  Exercise or strenuous activity. SIGNS AND SYMPTOMS   Wheezing.  Excessive coughing, particularly at night.  Chest tightness.  Shortness of breath. DIAGNOSIS  Your health care provider will ask you about your medical  history and perform a physical exam. A chest X-ray or blood testing may be performed to look for other causes of your symptoms or other conditions that may have triggered your asthma attack. TREATMENT  Treatment is aimed at reducing inflammation and opening up the airways in your lungs. Most asthma attacks are treated with inhaled medicines. These include quick relief or rescue medicines (such as bronchodilators) and controller medicines (such as inhaled corticosteroids). These medicines are sometimes given through an inhaler or a nebulizer. Systemic steroid medicine taken by mouth or given through an IV tube also can be used to reduce the inflammation when an attack is moderate or severe. Antibiotic medicines are only used if a bacterial infection is present.  HOME CARE INSTRUCTIONS   Rest.  Drink plenty of  liquids. This helps the mucus to remain thin and be easily coughed up. Only use caffeine in moderation and do not use alcohol until you have recovered from your illness.  Do not smoke. Avoid being exposed to secondhand smoke.  You play a critical role in keeping yourself in good health. Avoid exposure to things that cause you to wheeze or to have breathing problems.  Keep your medicines up-to-date and available. Carefully follow your health care provider's treatment plan.  Take your medicine exactly as prescribed.  When pollen or pollution is bad, keep windows closed and use an air conditioner or go to places with air conditioning.  Asthma requires careful medical care. See your health care provider for a follow-up as advised. If you are more than [redacted] weeks pregnant and you were prescribed any new medicines, let your obstetrician know about the visit and how you are doing. Follow up with your health care provider as directed.  After you have recovered from your asthma attack, make an appointment with your outpatient doctor to talk about ways to reduce the likelihood of future attacks. If you do not have a doctor who manages your asthma, make an appointment with a primary care doctor to discuss your asthma. SEEK IMMEDIATE MEDICAL CARE IF:   You are getting worse.  You have trouble breathing. If severe, call your local emergency services (911 in the [place]  You develop chest pain or discomfort.  You are vomiting.  You are not able to keep fluids down.  You are coughing up yellow, green, brown, or bloody sputum.  You have a fever and your symptoms suddenly get worse.  You have trouble swallowing. MAKE SURE YOU:   Understand these instructions.  Will watch your condition.  Will get help right away if you are not doing well or get worse.   This information is not intended to replace advice given to you by your health care provider. Make sure you discuss any questions you have with  your health care provider.   Document Released: 04/29/2006 Document Revised: 01/17/2013 Document Reviewed: 07/20/2012 Elsevier Interactive Patient Education [employer].

## 2015-02-13 NOTE — ED Notes (Addendum)
Pt did not improve from visit on 02/06/15, symptoms have changes to sweats, cough is more persistent with blood tinged sputum, sore throat and weakness. Chest is very sore with cough.

## 2015-02-17 ENCOUNTER — Telehealth: Payer: Self-pay | Admitting: Emergency Medicine

## 2015-06-24 ENCOUNTER — Emergency Department (INDEPENDENT_AMBULATORY_CARE_PROVIDER_SITE_OTHER)
Admission: EM | Admit: 2015-06-24 | Discharge: 2015-06-24 | Disposition: A | Discharge status: Home / Self Care | Payer: BC Managed Care – PPO | Source: Home / Self Care | Attending: Family Medicine | Admitting: Family Medicine

## 2015-06-24 ENCOUNTER — Encounter: Payer: Self-pay | Admitting: Emergency Medicine

## 2015-06-24 DIAGNOSIS — J029 Acute pharyngitis, unspecified: Secondary | ICD-10-CM | POA: Diagnosis not present

## 2015-06-24 LAB — POCT RAPID STREP A (OFFICE): Rapid Strep A Screen: NEGATIVE

## 2015-06-24 NOTE — ED Notes (Signed)
Pt c/o sore throat and HA x3 days.  Denies fever.

## 2015-06-24 NOTE — ED Provider Notes (Signed)
CSN: [id number]     Arrival date & time 06/24/15  1011 History   First MD Initiated Contact with Patient 06/24/15 1121     Chief Complaint  Patient presents with  . Sore Throat   (Consider location/radiation/quality/duration/timing/severity/associated sxs/prior Treatment) HPI  The pt is a 60yo female presenting to [facility] with c/o 3 days of generalized headache and sore throat. Throat pain is 7/10, worse with swallowing, aching and sore. Denies cough, congestion, n/v/d. Denies sick contacts or recent travel.  She has not taken anything for pain.   Past Medical History  Diagnosis Date  . Depression   . Migraines    Past Surgical History  Procedure Laterality Date  . Cholecystectomy     Family History  Problem Relation Age of Onset  . Cancer Father   . Stroke Father   . Diabetes Father   . Cancer Sister     breast  . Asthma Mother    Social History  Substance Use Topics  . Smoking status: Never Smoker   . Smokeless tobacco: Never Used  . Alcohol Use: No   OB History    No data available     Review of Systems  Constitutional: Negative for fever and chills.  HENT: Positive for sore throat. Negative for congestion, ear pain, trouble swallowing and voice change.   Respiratory: Negative for cough and shortness of breath.   Cardiovascular: Negative for chest pain and palpitations.  Gastrointestinal: Negative for nausea, vomiting, abdominal pain and diarrhea.  Musculoskeletal: Negative for myalgias, back pain and arthralgias.  Skin: Negative for rash.  Neurological: Positive for headaches. Negative for dizziness and light-headedness.    Allergies  Review of patient's allergies indicates no known allergies.  Home Medications   Prior to Admission medications   Medication Sig Start Date End Date Taking? Authorizing Provider  albuterol (PROVENTIL) (2.5 MG/3ML) 0.083% nebulizer solution Take 3 mLs (2.5 mg total) by nebulization every 6 (six) hours as needed for wheezing or  shortness of breath. 02/13/15   Noland Fordyce, PA-C  doxycycline (VIBRAMYCIN) 100 MG capsule Take 1 capsule (100 mg total) by mouth 2 (two) times daily. One po bid x 7 days 02/13/15   Noland Fordyce, PA-C  venlafaxine XR (EFFEXOR-XR) 150 MG 24 hr capsule Take 150 mg by mouth daily.    Historical Provider, MD   Meds Ordered and Administered this Visit  Medications - No data to display  BP 142/87 mmHg  Pulse 77  Temp(Src) 98.3 F (36.8 C) (Oral)  Wt 193 lb (87.544 kg)  SpO2 95% No data found.   Physical Exam  Constitutional: She appears well-developed and well-nourished. No distress.  HENT:  Head: Normocephalic and atraumatic.  Right Ear: Tympanic membrane normal.  Left Ear: Tympanic membrane normal.  Nose: Nose normal.  Mouth/Throat: Uvula is midline and mucous membranes are normal. Posterior oropharyngeal erythema present. No oropharyngeal exudate, posterior oropharyngeal edema or tonsillar abscesses.  Eyes: Conjunctivae are normal. No scleral icterus.  Neck: Normal range of motion. Neck supple.  Cardiovascular: Normal rate, regular rhythm and normal heart sounds.   Pulmonary/Chest: Effort normal and breath sounds normal. No stridor. No respiratory distress. She has no wheezes. She has no rales.  Abdominal: Soft. She exhibits no distension. There is no tenderness.  Musculoskeletal: Normal range of motion.  Lymphadenopathy:    She has no cervical adenopathy.  Neurological: She is alert.  Skin: Skin is warm and dry. She is not diaphoretic.  Nursing note and vitals reviewed.   ED  Course  Procedures (including critical care time)  Labs Review Labs Reviewed  STREP A DNA PROBE  POCT RAPID STREP A (OFFICE)    Imaging Review No results found.    MDM   1. Sore throat    Pt c/o sore throat. No evidence of peritonsillar abscess.  Rapid strep: Negative Will send culture.  Symptoms likely viral Encouraged salt water gargles and taking acetaminophen with ibuprofen to help  with pain. F/u with PCP in 1 week if not improving, sooner if worsening. Patient verbalized understanding and agreement with treatment plan.     Noland Fordyce, PA-C 06/24/15 1537

## 2015-06-24 NOTE — Discharge Instructions (Signed)
You may take 400-600mg  Ibuprofen (Motrin) every 6-8 hours for fever and pain  Alternate with Tylenol  You may take 500mg  Tylenol every 4-6 hours as needed for fever and pain  Follow-up with your primary care provider next week for recheck of symptoms if not improving.  Be sure to drink plenty of fluids and rest, at least 8hrs of sleep a night, preferably more while you are sick. Return urgent care or go to closest ER if you cannot keep down fluids/signs of dehydration, fever not reducing with Tylenol, difficulty breathing/wheezing, stiff neck, worsening condition, or other concerns (see below)   You may also continue saltwater gargles and stick with a softer diet such as pasta, soups, yogurt, jell-o, and apple sauce until throat pain improves.   Please follow up with your primary care provider in 7-10 days if not improving, sooner if worsening.    Sore Throat A sore throat is pain, burning, irritation, or scratchiness of the throat. There is often pain or tenderness when swallowing or talking. A sore throat may be accompanied by other symptoms, such as coughing, sneezing, fever, and swollen neck glands. A sore throat is often the first sign of another sickness, such as a cold, flu, strep throat, or mononucleosis (commonly known as mono). Most sore throats go away without medical treatment. CAUSES  The most common causes of a sore throat include:  A viral infection, such as a cold, flu, or mono.  A bacterial infection, such as strep throat, tonsillitis, or whooping cough.  Seasonal allergies.  Dryness in the air.  Irritants, such as smoke or pollution.  Gastroesophageal reflux disease (GERD). HOME CARE INSTRUCTIONS   Only take over-the-counter medicines as directed by your caregiver.  Drink enough fluids to keep your urine clear or pale yellow.  Rest as needed.  Try using throat sprays, lozenges, or sucking on hard candy to ease any pain (if older than 4 years or as  directed).  Sip warm liquids, such as broth, herbal tea, or warm water with honey to relieve pain temporarily. You may also eat or drink cold or frozen liquids such as frozen ice pops.  Gargle with salt water (mix 1 tsp salt with 8 oz of water).  Do not smoke and avoid secondhand smoke.  Put a cool-mist humidifier in your bedroom at night to moisten the air. You can also turn on a hot shower and sit in the bathroom with the door closed for 5-10 minutes. SEEK IMMEDIATE MEDICAL CARE IF:  You have difficulty breathing.  You are unable to swallow fluids, soft foods, or your saliva.  You have increased swelling in the throat.  Your sore throat does not get better in 7 days.  You have nausea and vomiting.  You have a fever or persistent symptoms for more than 2-3 days.  You have a fever and your symptoms suddenly get worse. MAKE SURE YOU:   Understand these instructions.  Will watch your condition.  Will get help right away if you are not doing well or get worse.   This information is not intended to replace advice given to you by your health care provider. Make sure you discuss any questions you have with your health care provider.   Document Released: 02/20/2004 Document Revised: 02/02/2014 Document Reviewed: 09/20/2011 Elsevier Interactive Patient Education 2016 [employer]  Rapid Strep Test Strep throat is a bacterial infection caused by the bacteria Streptococcus pyogenes. A rapid strep test is the quickest way to check if these bacteria  are causing your sore throat. The test can be done at your health care provider's office. Results are usually ready in 10-20 minutes. You may have this test if you have symptoms of strep throat. These include:   A red throat with yellow or white spots.  Neck swelling and tenderness.  Fever.  Loss of appetite.  Trouble breathing or swallowing.  Rash.  Dehydration. This test requires a sample of fluid from the back of your throat  and tonsils. Your health care provider may hold down your tongue with a tongue depressor and use a swab to collect the sample.  Your health care provider may collect a second sample at the same time. The second sample may be used for a throat culture. In a culture test, the sample is combined with a substance that encourages bacteria to grow. It takes longer to get the results of the throat culture test, but they are more accurate. They can confirm the results from a rapid strep test, or show that those results were wrong. RESULTS  It is your responsibility to obtain your test results. Ask the lab or department performing the test when and how you will get your results. Contact your health care provider to discuss any questions you have about your results.  The results of the rapid strep test will be negative or positive.  Meaning of Negative Test Results If the result of your rapid strep test is negative, then it means:   It is likely that you do not have strep throat.  A virus may be causing your sore throat. Your health care provider may do a throat culture to confirm the results of the rapid strep test. The throat culture can also identify the different strains of strep bacteria. Meaning of Positive Test Results If the result of your rapid strep test is positive, then it means:  It is likely that you do have strep throat.  You may have to take antibiotics. Your health care provider may do a throat culture to confirm the results of the rapid strep test. Strep throat usually requires a course of antibiotics.    This information is not intended to replace advice given to you by your health care provider. Make sure you discuss any questions you have with your health care provider.   Document Released: 02/20/2004 Document Revised: 02/02/2014 Document Reviewed: 04/20/2013 Elsevier Interactive Patient Education [employer].

## 2015-06-25 ENCOUNTER — Telehealth: Payer: Self-pay | Admitting: *Deleted

## 2015-06-25 LAB — STREP A DNA PROBE: GASP: NOT DETECTED

## 2015-06-25 NOTE — ED Notes (Unsigned)
LM with Tcx results and to call back if she has any questions or concerns.  

## 2015-07-03 ENCOUNTER — Telehealth: Payer: Self-pay | Admitting: *Deleted

## 2015-07-03 MED ORDER — MAGIC MOUTHWASH W/LIDOCAINE: 5.0000 mL | Freq: Three times a day (TID) | ORAL | Status: DC | PRN

## 2015-07-03 NOTE — ED Notes (Signed)
Pt called reporting she developed a sore on the right side of her throat and throat feels worse. She requested an antibiotic. I sent Junie Panning a note, since her rapid and TCX were negative, she advised likely a canker sore developed or ulceration from a virus, encouraged tylenol and IBF and salter water gargles, called in magic mouthwash. I attempted to tell patient instructions, she became irritated we did not send an antibiotic, asked when Dr. Assunta Found would be back, she will call back when Dr. Assunta Found is here because he will call one in for her. She then hung up. I did not get to finish giving her instructions.

## 2015-07-03 NOTE — ED Notes (Signed)
Patient was here on 06/24/15 for sore throat. She reports she is worse with a blister on the right side of her throat and possibly has a fever. She is inquiring about an antibiotic.

## 2016-01-18 ENCOUNTER — Encounter: Payer: Self-pay | Admitting: Emergency Medicine

## 2016-01-18 ENCOUNTER — Emergency Department (INDEPENDENT_AMBULATORY_CARE_PROVIDER_SITE_OTHER)
Admission: EM | Admit: 2016-01-18 | Discharge: 2016-01-18 | Disposition: A | Discharge status: Home / Self Care | Payer: BC Managed Care – PPO | Source: Home / Self Care | Attending: Family Medicine | Admitting: Family Medicine

## 2016-01-18 DIAGNOSIS — J0101 Acute recurrent maxillary sinusitis: Secondary | ICD-10-CM

## 2016-01-18 MED ORDER — METHYLPREDNISOLONE ACETATE 80 MG/ML IJ SUSP
80.0000 mg | Freq: Once | INTRAMUSCULAR | Status: AC
  Administered 2016-01-18: 80 mg via INTRAMUSCULAR

## 2016-01-18 MED ORDER — DOXYCYCLINE HYCLATE 100 MG PO CAPS: 100.0000 mg | ORAL_CAPSULE | Freq: Two times a day (BID) | ORAL | 0 refills | Status: DC

## 2016-01-18 NOTE — ED Provider Notes (Signed)
[facility]    CSN: [id number] Arrival date & time: 01/18/16  1109     History   Chief Complaint Chief Complaint  Patient presents with  . Facial Pain    HPI Tammy Carrillo is a 60 y.o. female.   Patient complains of two week history of persistent sinus congestion, without sore throat or cough.  She has developed a headache and dizziness.  Yesterday her right ear hurt, and last night she had fever to 100.  Her symptoms have not improved with Sudafed and Flonase.   The history is provided by the patient.    Past Medical History:  Diagnosis Date  . Depression   . Migraines     There are no active problems to display for this patient.   Past Surgical History:  Procedure Laterality Date  . CHOLECYSTECTOMY      OB History    No data available       Home Medications    Prior to Admission medications   Medication Sig Start Date End Date Taking? Authorizing Provider  doxycycline (VIBRAMYCIN) 100 MG capsule Take 1 capsule (100 mg total) by mouth 2 (two) times daily. Take with food. 01/18/16   Kandra Nicolas, MD  venlafaxine XR (EFFEXOR-XR) 150 MG 24 hr capsule Take 150 mg by mouth daily.    Historical Provider, MD    Family History Family History  Problem Relation Age of Onset  . Cancer Father   . Stroke Father   . Diabetes Father   . Cancer Sister     breast  . Asthma Mother     Social History Social History  Substance Use Topics  . Smoking status: Never Smoker  . Smokeless tobacco: Never Used  . Alcohol use No     Allergies   Patient has no known allergies.   Review of Systems Review of Systems No sore throat No cough No pleuritic pain No wheezing + nasal congestion + post-nasal drainage + sinus pain/pressure No itchy/red eyes + right earache No hemoptysis No SOB + fever, + chills No nausea No vomiting No abdominal pain No diarrhea No urinary symptoms No skin rash + fatigue + dizzy No myalgias +  headache Used OTC meds without relief   Physical Exam Triage Vital Signs ED Triage Vitals  Enc Vitals Group     BP 01/18/16 1155 128/85     Pulse Rate 01/18/16 1155 68     Resp --      Temp 01/18/16 1155 98.1 F (36.7 C)     Temp Source 01/18/16 1155 Oral     SpO2 01/18/16 1155 97 %     Weight 01/18/16 1155 190 lb 4 oz (86.3 kg)     Height 01/18/16 1155 5\' 7"  (1.702 m)     Head Circumference --      Peak Flow --      Pain Score 01/18/16 1157 0     Pain Loc --      Pain Edu? --      Excl. in [place]? --    No data found.   Updated Vital Signs BP 128/85 (BP Location: Left Arm)   Pulse 68   Temp 98.1 F (36.7 C) (Oral)   Ht 5\' 7"  (1.702 m)   Wt 190 lb 4 oz (86.3 kg)   SpO2 97%   BMI 29.80 kg/m   Visual Acuity Right Eye Distance:   Left Eye Distance:   Bilateral Distance:    Right  Eye Near:   Left Eye Near:    Bilateral Near:     Physical Exam Nursing notes and Vital Signs reviewed. Appearance:  Patient appears stated age, and in no acute distress Eyes:  Pupils are equal, round, and reactive to light and accomodation.  Extraocular movement is intact.  Conjunctivae are not inflamed  Ears:  Canals normal.  Tympanic membranes normal.  Nose:  Congested turbinates.  Maxillary sinus tenderness is present.  Pharynx:  Normal Neck:  Supple.  Tender enlarged posterior/lateral nodes are palpated bilaterally  Lungs:  Clear to auscultation.  Breath sounds are equal.  Moving air well. Heart:  Regular rate and rhythm without murmurs, rubs, or gallops.  Abdomen:  Nontender without masses or hepatosplenomegaly.  Bowel sounds are present.  No CVA or flank tenderness.  Extremities:  No edema.  Skin:  No rash present.    UC Treatments / Results  Labs (all labs ordered are listed, but only abnormal results are displayed) Labs Reviewed - No data to display  EKG  EKG Interpretation None       Radiology Review of CT head without contrast done 07/05/14: Inflammatory mucosal  thickening throughout the bilateral paranasal sinuses with retained secretions in the left sphenoid and right maxilla.  Procedures Procedures (including critical care time)  Medications Ordered in UC Medications  methylPREDNISolone acetate (DEPO-MEDROL) injection 80 mg (not administered)     Initial Impression / Assessment and Plan / UC Course  I have reviewed the triage vital signs and the nursing notes.  Pertinent labs & imaging results that were available during my care of the patient were reviewed by me and considered in my medical decision making (see chart for details).  Clinical Course   Suspect chronic sinus inflammation based on results of CT head done 07/05/14. Administered Depo Medrol 80mg  IM  Begin doxycycline 100mg  BID. Continue pseudoephedrine for sinus congestion.  Take plenty of fluids.  Get adequate rest.   May use Afrin nasal spray (or generic oxymetazoline) twice daily for about 5 days and then discontinue.  Also recommend using saline nasal spray several times daily and saline nasal irrigation (AYR is a common brand).  Use Flonase nasal spray each morning after using Afrin nasal spray and saline nasal irrigation. Try warm salt water gargles for sore throat.  Stop all antihistamines for now, and other non-prescription cough/cold preparations. Followup with Family Doctor if not improved in one week.      Final Clinical Impressions(s) / UC Diagnoses   Final diagnoses:  Acute recurrent maxillary sinusitis    New Prescriptions New Prescriptions   DOXYCYCLINE (VIBRAMYCIN) 100 MG CAPSULE    Take 1 capsule (100 mg total) by mouth 2 (two) times daily. Take with food.     Kandra Nicolas, MD 02/03/16 [phone]

## 2016-01-18 NOTE — Discharge Instructions (Signed)
Continue pseudoephedrine for sinus congestion.  Take plenty of fluids.  Get adequate rest.   May use Afrin nasal spray (or generic oxymetazoline) twice daily for about 5 days and then discontinue.  Also recommend using saline nasal spray several times daily and saline nasal irrigation (AYR is a common brand).  Use Flonase nasal spray each morning after using Afrin nasal spray and saline nasal irrigation. Try warm salt water gargles for sore throat.  Stop all antihistamines for now, and other non-prescription cough/cold preparations.

## 2016-01-18 NOTE — ED Triage Notes (Signed)
Pt c/o possible sinus infection x 2 weeks, head congestion, taking Tylenol last night due to fever, sneezing, no cough.

## 2016-06-28 ENCOUNTER — Emergency Department (INDEPENDENT_AMBULATORY_CARE_PROVIDER_SITE_OTHER)
Admission: EM | Admit: 2016-06-28 | Discharge: 2016-06-28 | Disposition: A | Discharge status: Home / Self Care | Payer: BC Managed Care – PPO | Source: Home / Self Care | Attending: Family Medicine | Admitting: Family Medicine

## 2016-06-28 ENCOUNTER — Encounter: Payer: Self-pay | Admitting: Emergency Medicine

## 2016-06-28 DIAGNOSIS — J069 Acute upper respiratory infection, unspecified: Secondary | ICD-10-CM

## 2016-06-28 MED ORDER — DOXYCYCLINE HYCLATE 100 MG PO CAPS: 100.0000 mg | ORAL_CAPSULE | Freq: Two times a day (BID) | ORAL | 0 refills | Status: DC

## 2016-06-28 NOTE — ED Triage Notes (Signed)
Patient presents to [facility] with C/O productive cough yellow sputum , nasal congestion and intermittent fever times 1 1/2 weeks ago. Patient states coughing so much that chest feels raw and burning sensation with cough.

## 2016-06-28 NOTE — ED Provider Notes (Signed)
CSN: [id number]     Arrival date & time 06/28/16  1139 History   First MD Initiated Contact with Patient 06/28/16 1229     Chief Complaint  Patient presents with  . Cough  . Nasal Congestion  . Fever   (Consider location/radiation/quality/duration/timing/severity/associated sxs/prior Treatment) HPI  Tammy Carrillo is a 61 y.o. female presenting to UC with c/o 1.5 weeks of gradually worsening cough, congestion with yellow sputum and intermittent subjective fever. She notes her chest feels raw and burning sensation from the cough.  She has tried OTC cough/cold medication with mild temporary relief. Denies sick contacts or recent travel. Denies n/v/d.    Past Medical History:  Diagnosis Date  . Depression   . Migraines    Past Surgical History:  Procedure Laterality Date  . CHOLECYSTECTOMY     Family History  Problem Relation Age of Onset  . Cancer Father   . Stroke Father   . Diabetes Father   . Cancer Sister        breast  . Asthma Mother    Social History  Substance Use Topics  . Smoking status: Never Smoker  . Smokeless tobacco: Never Used  . Alcohol use No   OB History    No data available     Review of Systems  Constitutional: Positive for fever. Negative for chills.  HENT: Positive for congestion, postnasal drip, rhinorrhea and sore throat. Negative for ear pain, trouble swallowing and voice change.   Respiratory: Positive for cough. Negative for shortness of breath.   Cardiovascular: Negative for chest pain and palpitations.  Gastrointestinal: Negative for abdominal pain, diarrhea, nausea and vomiting.  Musculoskeletal: Negative for arthralgias, back pain and myalgias.  Skin: Negative for rash.    Allergies  Patient has no known allergies.  Home Medications   Prior to Admission medications   Medication Sig Start Date End Date Taking? Authorizing Provider  doxycycline (VIBRAMYCIN) 100 MG capsule Take 1 capsule (100 mg total) by mouth 2 (two) times  daily. One po bid x 7 days 06/28/16   Noland Fordyce, PA-C  venlafaxine XR (EFFEXOR-XR) 150 MG 24 hr capsule Take 150 mg by mouth daily.    [provider]   Meds Ordered and Administered this Visit  Medications - No data to display  Pulse 79   Temp 98.8 F (37.1 C) (Oral)   Resp 16   Ht 5\' 7"  (1.702 m)   Wt 191 lb 12 oz (87 kg)   SpO2 98%   BMI 30.03 kg/m  No data found.   Physical Exam  Constitutional: She is oriented to person, place, and time. She appears well-developed and well-nourished. No distress.  HENT:  Head: Normocephalic and atraumatic.  Right Ear: Tympanic membrane normal.  Left Ear: Tympanic membrane normal.  Nose: Mucosal edema present.  Mouth/Throat: Uvula is midline, oropharynx is clear and moist and mucous membranes are normal.  Eyes: EOM are normal.  Neck: Normal range of motion. Neck supple.  Cardiovascular: Normal rate and regular rhythm.   Pulmonary/Chest: Effort normal and breath sounds normal. No stridor. No respiratory distress. She has no wheezes. She has no rales.  Musculoskeletal: Normal range of motion.  Lymphadenopathy:    She has no cervical adenopathy.  Neurological: She is alert and oriented to person, place, and time.  Skin: Skin is warm and dry. She is not diaphoretic.  Psychiatric: She has a normal mood and affect. Her behavior is normal.  Nursing note and vitals reviewed.  Urgent Care Course     Procedures (including critical care time)  Labs Review Labs Reviewed - No data to display  Imaging Review No results found.   MDM   1. Upper respiratory tract infection, unspecified type    Will cover for bacterial cause given duration of worsening symptoms despite OTC medications.  Rx: Doxycycline (pt has stated in the past Azithromycin does not help)   F/u with PCP in 1 week if not improving.     Noland Fordyce, PA-C 06/28/16 7615

## 2016-09-03 ENCOUNTER — Ambulatory Visit (INDEPENDENT_AMBULATORY_CARE_PROVIDER_SITE_OTHER): Payer: BC Managed Care – PPO | Admitting: Osteopathic Medicine

## 2016-09-03 ENCOUNTER — Encounter (INDEPENDENT_AMBULATORY_CARE_PROVIDER_SITE_OTHER): Payer: Self-pay

## 2016-09-03 ENCOUNTER — Encounter: Payer: Self-pay | Admitting: Osteopathic Medicine

## 2016-09-03 ENCOUNTER — Ambulatory Visit (INDEPENDENT_AMBULATORY_CARE_PROVIDER_SITE_OTHER): Payer: BC Managed Care – PPO

## 2016-09-03 VITALS — BP 126/84 | HR 69 | Ht 67.0 in | Wt 196.0 lb

## 2016-09-03 DIAGNOSIS — M25552 Pain in left hip: Secondary | ICD-10-CM

## 2016-09-03 DIAGNOSIS — F339 Major depressive disorder, recurrent, unspecified: Secondary | ICD-10-CM | POA: Diagnosis not present

## 2016-09-03 DIAGNOSIS — Z8669 Personal history of other diseases of the nervous system and sense organs: Secondary | ICD-10-CM | POA: Diagnosis not present

## 2016-09-03 DIAGNOSIS — M545 Low back pain: Secondary | ICD-10-CM

## 2016-09-03 DIAGNOSIS — G8929 Other chronic pain: Secondary | ICD-10-CM

## 2016-09-03 MED ORDER — SUMATRIPTAN SUCCINATE 50 MG PO TABS: 50.0000 mg | ORAL_TABLET | ORAL | 0 refills | Status: DC | PRN

## 2016-09-03 MED ORDER — NAPROXEN-ESOMEPRAZOLE 500-20 MG PO TBEC: 1.0000 | DELAYED_RELEASE_TABLET | Freq: Two times a day (BID) | ORAL | 3 refills | Status: DC

## 2016-09-03 MED ORDER — DICLOFENAC SODIUM 1 % TD GEL: 2.0000 g | Freq: Four times a day (QID) | TRANSDERMAL | 11 refills | Status: DC

## 2016-09-03 NOTE — Patient Instructions (Signed)
Plan:  New medications for headache and pain  Imitrex as needed at onset of headache  Vimovo daily for aches/pains - if too expensive we can substitute cheaper alternatives  Voltaren topical gel for aches/pain - will not affect stomach  Will place referral to physical therapy to address back pain and hip pain. Also see printed home exercises.   If back and hip pain not better or if it gets worse, I would recommend follow-up with one of our sports medicine specialists (Dr Georgina Snell or Dr. Patton Salles Dr. Darene Lamer) for further evaluation in 2-4 weeks. Just call our office and ask for an appointment for sports medicine!   Will request records form OBGYN for recent labs and screening - if any major gaps we will address these at next visit  Will keep an eye on you in 3-6 months for routine care / annual physical, sooner if needed!
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
## 2016-09-14 ENCOUNTER — Ambulatory Visit (INDEPENDENT_AMBULATORY_CARE_PROVIDER_SITE_OTHER): Payer: BC Managed Care – PPO | Admitting: Physical Therapy

## 2016-09-14 DIAGNOSIS — M6281 Muscle weakness (generalized): Secondary | ICD-10-CM

## 2016-09-14 DIAGNOSIS — G8929 Other chronic pain: Secondary | ICD-10-CM | POA: Diagnosis not present

## 2016-09-14 DIAGNOSIS — M545 Low back pain, unspecified: Secondary | ICD-10-CM

## 2016-09-14 DIAGNOSIS — M25552 Pain in left hip: Secondary | ICD-10-CM | POA: Diagnosis not present

## 2016-09-14 NOTE — Therapy (Signed)
[place], [place], [address] Phone: [phone]   Fax:  [phone]  Physical Therapy Evaluation  Patient Details  Name: Tammy Carrillo MRN: [id number] Date of Birth: 1955/11/08 Referring Provider: Emeterio Reeve, DO  Encounter Date: 09/14/2016      PT End of Session - 09/14/16 1242    Visit Number 1   Number of Visits 12   Date for PT Re-Evaluation 10/26/16   Authorization Type BCBS State $52 copay; 40 visit limit   PT Start Time 1145   PT Stop Time 1227   PT Time Calculation (min) 42 min   Activity Tolerance Patient tolerated treatment well   Behavior During Therapy [facility] for tasks assessed/performed      Past Medical History:  Diagnosis Date  . Depression   . Migraines     Past Surgical History:  Procedure Laterality Date  . CHOLECYSTECTOMY      There were no vitals filed for this visit.       Subjective Assessment - 09/14/16 1147    Subjective Pt is a 61 y/o female who presents to OPPT for LBP and Lt hip pain affecting ability to exercise.  Pt reports back pain is chronic but Lt hip pain is new x 1 month.     Pertinent History depression, migraines   Limitations Sitting;Walking   How long can you sit comfortably? on hard benches: 45 min   How long can you walk comfortably? pain in Lt hip immediately with first step   Diagnostic tests xrays: bursitis   Currently in Pain? Yes   Pain Score 0-No pain  up to 7/10 in weight bearing   Pain Location Hip  and low back   Pain Orientation Left   Pain Descriptors / Indicators Sharp;Aching   Pain Type Chronic pain;Acute pain   Pain Onset More than a month ago   Pain Frequency Intermittent   Aggravating Factors  weight bearing, lifting, carrying objects, water aerobics   Pain Relieving Factors ice, pain medication            [facility] PT Assessment - 09/14/16 1152      Assessment   Medical Diagnosis LBP, Lt hip pain    Referring Provider Emeterio Reeve, DO   Onset Date/Surgical Date --  back pain chronic; Lt hip x 1 month   Next MD Visit PRN   Prior Therapy none     Precautions   Precautions None     Restrictions   Weight Bearing Restrictions No     Balance Screen   Has the patient fallen in the past 6 months No   Has the patient had a decrease in activity level because of a fear of falling?  No   Is the patient reluctant to leave their home because of a fear of falling?  No     Home Environment   Living Environment Private residence   Living Arrangements Spouse/significant other;Children  46 y/o son; 42 y/o foster child   Type of [place] Access Level entry   Home Layout One level;Laundry or work area in basement     Prior Function   Level of Independence Independent   Vocation Full time employment   Public house manager at [employer], walking, bike riding, scrapbooking, go to movies     Cognition   Overall Cognitive Status Within Functional Limits for tasks assessed     Observation/Other  Assessments   Focus on Therapeutic Outcomes (FOTO)  49 (51% limited; predicted 44% limited)     Posture/Postural Control   Posture/Postural Control Postural limitations     ROM / Strength   AROM / PROM / Strength AROM;Strength     AROM   AROM Assessment Site Lumbar   Lumbar Flexion WNL   Lumbar Extension WNL   Lumbar - Right Side Bend WNL   Lumbar - Left Side Bend WNL   Lumbar - Right Rotation WNL   Lumbar - Left Rotation WNL     Strength   Strength Assessment Site Hip;Knee;Ankle   Right/Left Hip Right;Left   Right Hip Flexion 5/5   Right Hip Extension 5/5   Right Hip External Rotation  4/5   Right Hip Internal Rotation 5/5   Right Hip ABduction 5/5   Left Hip Flexion 4/5   Left Hip Extension 4/5   Left Hip External Rotation 3/5   Left Hip Internal Rotation 3/5  with pain   Left Hip ABduction 3/5  with pain   Right/Left Knee  Right;Left   Right Knee Flexion 5/5   Right Knee Extension 5/5   Left Knee Flexion 5/5   Left Knee Extension 5/5   Right/Left Ankle Right;Left   Right Ankle Dorsiflexion 5/5   Left Ankle Dorsiflexion 5/5     Flexibility   Soft Tissue Assessment /Muscle Length yes  Lt hip flexor tightness noted   Quadriceps Lt tightness with active trigger points lateral quad   ITB Lt tightness   Piriformis Lt tightness     Palpation   Palpation comment tenderness and trigger points noted Lt lateral quad and TFL; tenderness bil PSIS     Special Tests    Special Tests Lumbar   Lumbar Tests Straight Leg Raise     Straight Leg Raise   Findings Negative            Objective measurements completed on examination: See above findings.          [facility] Adult PT Treatment/Exercise - 09/14/16 1152      Exercises   Exercises Knee/Hip     Knee/Hip Exercises: Stretches   Quad Stretch Left;1 rep;30 seconds   Quad Stretch Limitations prone with strap for HEP instruction   Hip Flexor Stretch Left;1 rep;30 seconds   Hip Flexor Stretch Limitations for HEP instruction   ITB Stretch Left;1 rep;30 seconds   ITB Stretch Limitations for HEP instruction   Piriformis Stretch Left;1 rep;30 seconds   Piriformis Stretch Limitations for HEP instruction                PT Education - 09/14/16 1242    Education provided Yes   Education Details HEP   Person(s) Educated Patient   Methods Explanation;Demonstration;Handout   Comprehension Verbalized understanding;Returned demonstration;Verbal cues required;Need further instruction             PT Long Term Goals - 09/14/16 1316      PT LONG TERM GOAL #1   Title independent with HEP   Time 6   Period Weeks   Status New   Target Date 10/26/16     PT LONG TERM GOAL #2   Title improve LLE strength to at least 4/5 for improved function   Time 6   Period Weeks   Status New   Target Date 10/26/16     PT LONG TERM GOAL #3   Title  report ability to amb > 30 min without increase in  pain for improved functional mobility   Time 6   Period Weeks   Status New   Target Date 10/26/16     PT LONG TERM GOAL #4   Title report ability to return to water exercise with pain < 2/10 for improved function   Time 6   Period Weeks   Status New   Target Date 10/26/16                Plan - 09/14/16 1244    Clinical Impression Statement Pt is a 61 y/o female who presents to OPPT for chronic LBP with new onset of Lt hip pain x 1 month.  Pt demonstrates decreased strength, decreased flexibility as well as postural abnormalities and trigger points affecting function.  Initiated HEP today and pt reports no pain with amb following session.  Will benefit from PT to address deficits.   History and Personal Factors relevant to plan of care: depression   Clinical Presentation Stable   Clinical Decision Making Low   Rehab Potential Good   PT Frequency 2x / week  may only see 1x/wk due to pt's work schedule   PT Duration 6 weeks   PT Treatment/Interventions ADLs/Self Care Home Management;Cryotherapy;Electrical Stimulation;Moist Heat;Iontophoresis 4mg /ml Dexamethasone;Ultrasound;Balance training;Therapeutic exercise;Traction;Therapeutic activities;Functional mobility training;Patient/family education;Manual techniques;Dry needling;Taping   PT Next Visit Plan review stretches; modalities, manual and TDN PRN; add strengthening to HEP   Consulted and Agree with Plan of Care Patient      Patient will benefit from skilled therapeutic intervention in order to improve the following deficits and impairments:  Pain, Increased fascial restricitons, Increased muscle spasms, Postural dysfunction, Decreased mobility, Impaired flexibility, Decreased strength, Decreased activity tolerance  Visit Diagnosis: Chronic midline low back pain without sciatica - Plan: PT plan of care cert/re-cert  Pain in left hip - Plan: PT plan of care  cert/re-cert  Muscle weakness (generalized) - Plan: PT plan of care cert/re-cert     Problem List There are no active problems to display for this patient.     Laureen Abrahams, PT, DPT 09/14/16 1:20 PM    [facility] [place], [place], [address] Phone: [phone]   Fax:  [phone]  Name: ROMY IPOCK MRN: [id number] Date of Birth: Jul 06, 1955

## 2016-09-14 NOTE — Patient Instructions (Signed)
KNEE: Quadriceps - Prone    Place strap around ankle. Bring ankle toward buttocks. Press hip into surface. Hold _30__ seconds. __2-3_ reps per set, _2-3__ sets per day, _6-7__ days per week  Outer Hip Stretch: Reclined IT Band Stretch (Strap)    Strap around opposite foot, pull across only as far as possible with shoulders on mat. Hold for __30__ seconds. Repeat __2-3__ times each leg.  Piriformis Stretch    Lying on back, pull left knee toward opposite shoulder. Hold __30__ seconds. Repeat _2-3___ times. Do __2-3__ sessions per day.  Hip Flexor Stretch    Lying on back near edge of bed, bend one leg, foot flat. Hang other leg over edge, relaxed, thigh resting entirely on bed for _30___ seconds. Repeat _2-3___ times. Do _2-3___ sessions per day.

## 2016-09-15 IMAGING — CT CT HEAD W/O CM
1 series · 16 of 30 positions shown, 20 images · non-contrast
Comparison: None.

CLINICAL DATA: Near syncope with dizziness.

EXAM:
CT HEAD WITHOUT CONTRAST
TECHNIQUE: Contiguous axial images were obtained from the base of the skull
through the vertex without intravenous contrast.

[Series 2: head 5.0 h30s · axial · 0.43mm/px · z∈[+1243,+1383]mm · 16 of 32 slices shown, 20 images]
[im 2/32  brain]
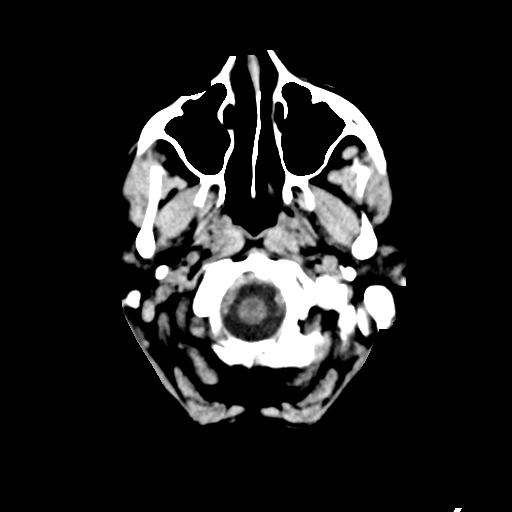
[im 2/32  bone]
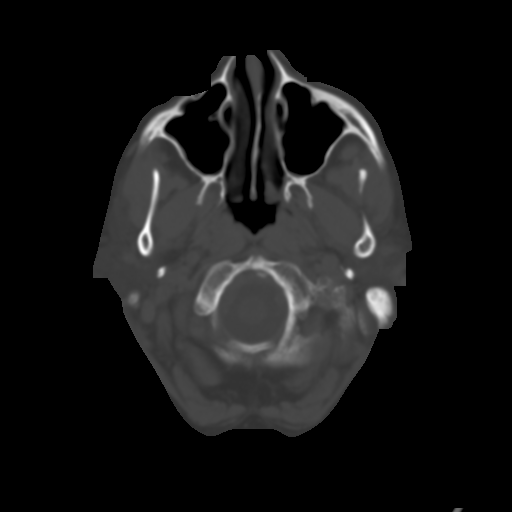
[im 4/32  brain]
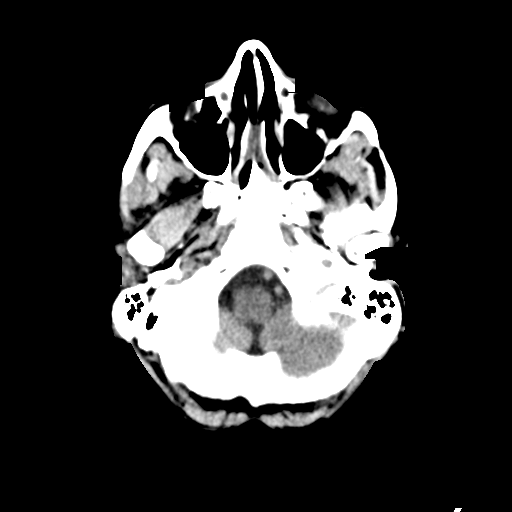
[im 6/32  brain]
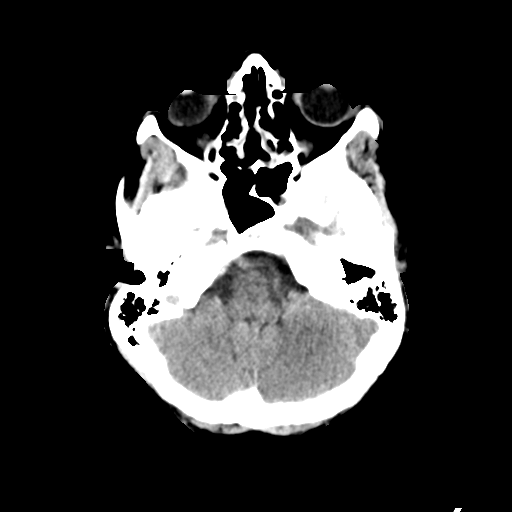
[im 8/32  brain]
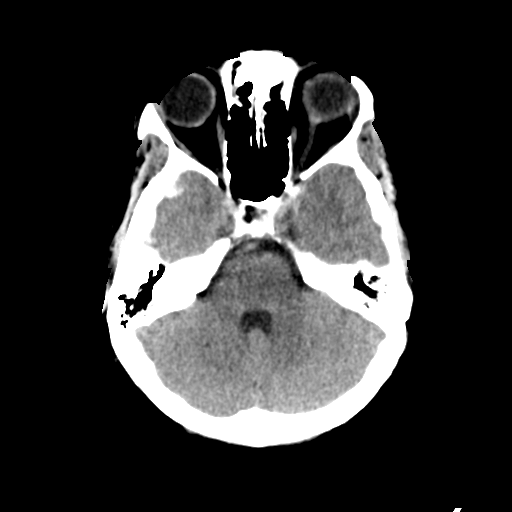
[im 9/32  brain]
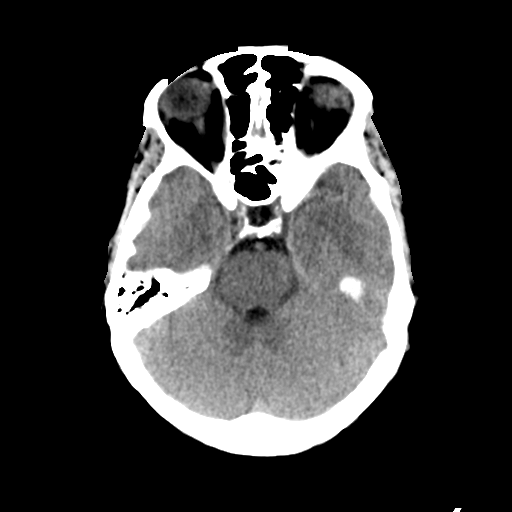
[im 9/32  bone]
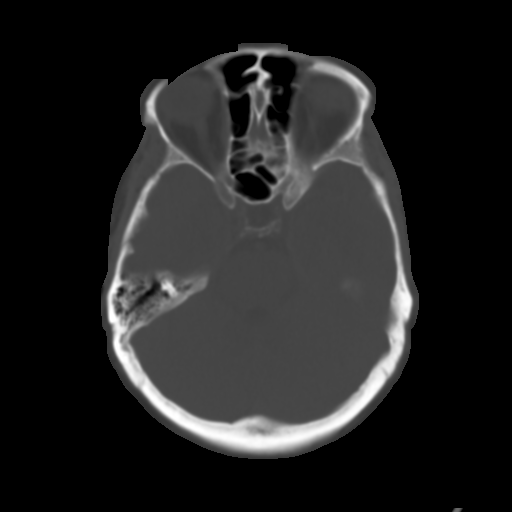
[im 11/32  brain]
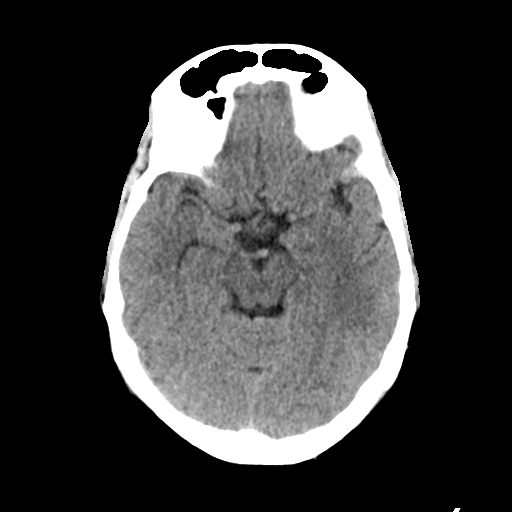
[im 13/32  brain]
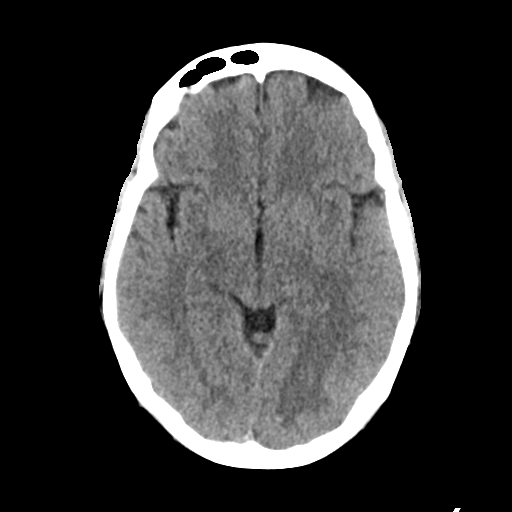
[im 15/32  brain]
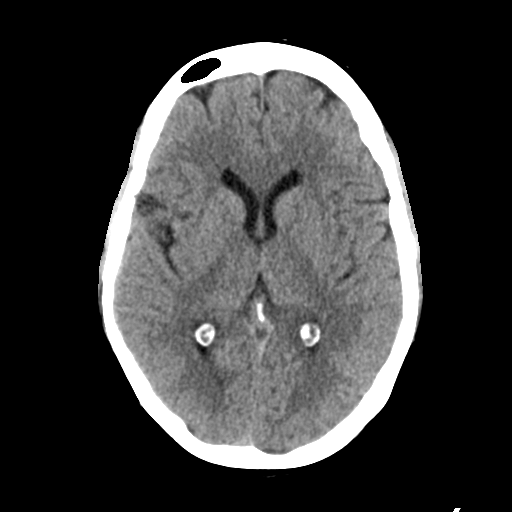
[im 17/32  brain]
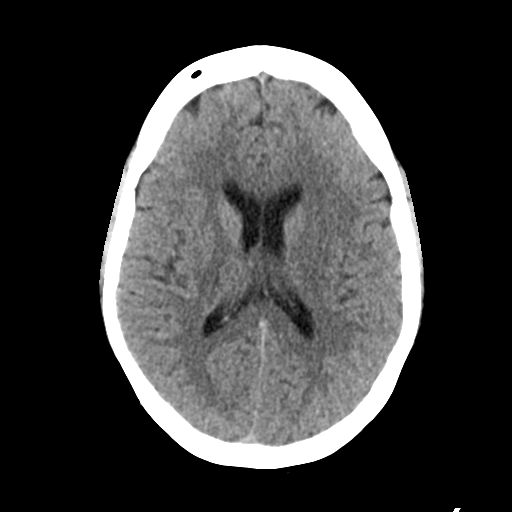
[im 17/32  bone]
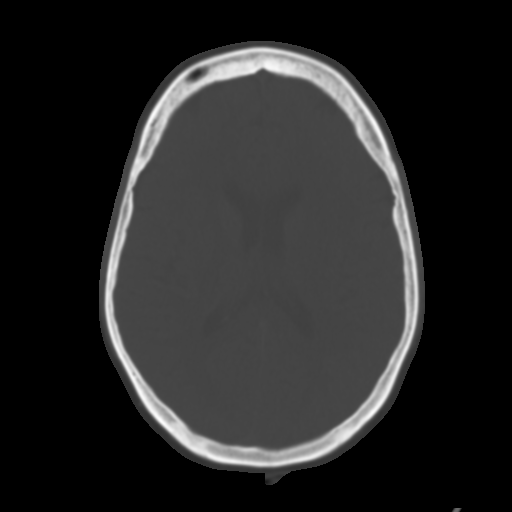
[im 19/32  brain]
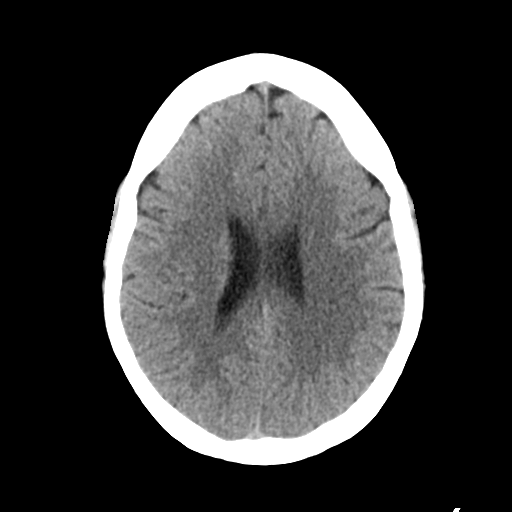
[im 21/32  brain]
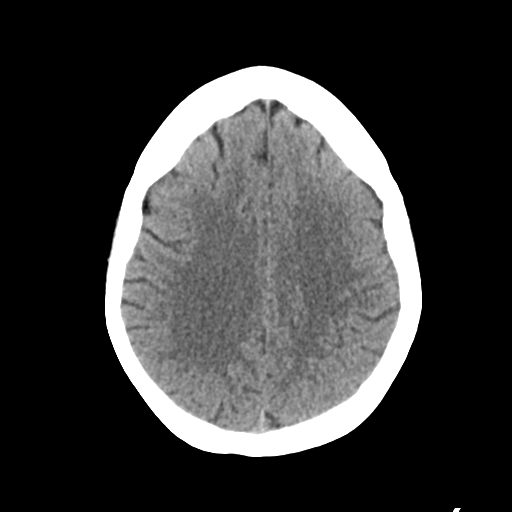
[im 23/32  brain]
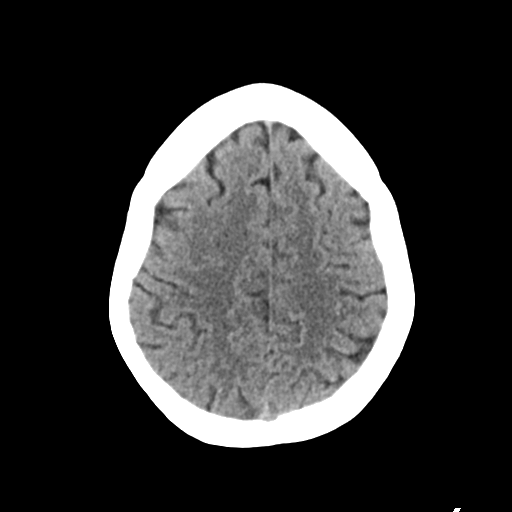
[im 24/32  brain]
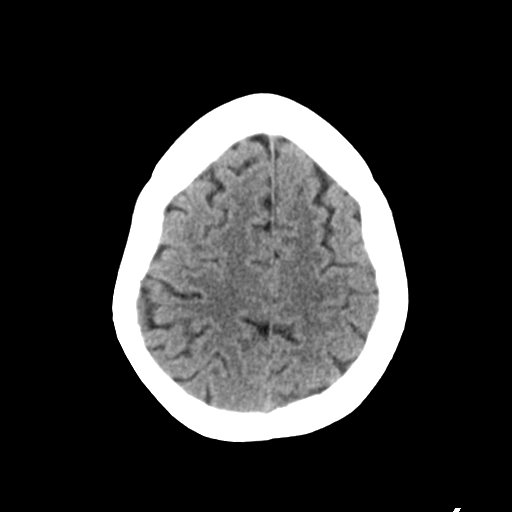
[im 24/32  bone]
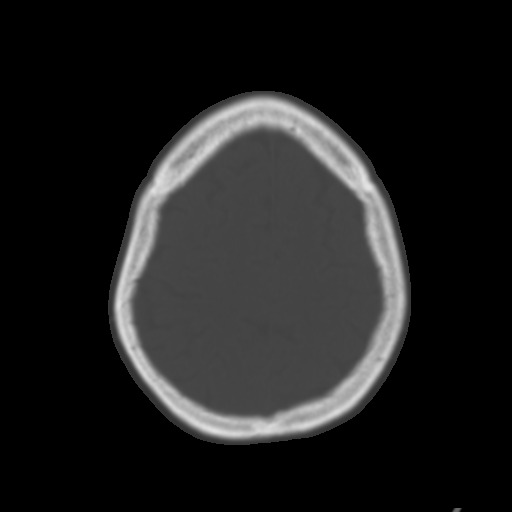
[im 26/32  brain]
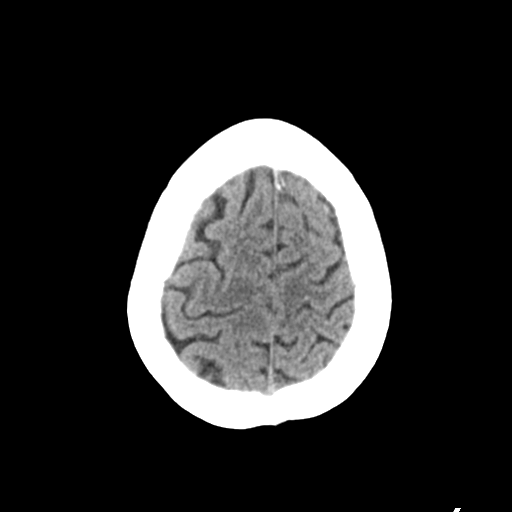
[im 28/32  brain]
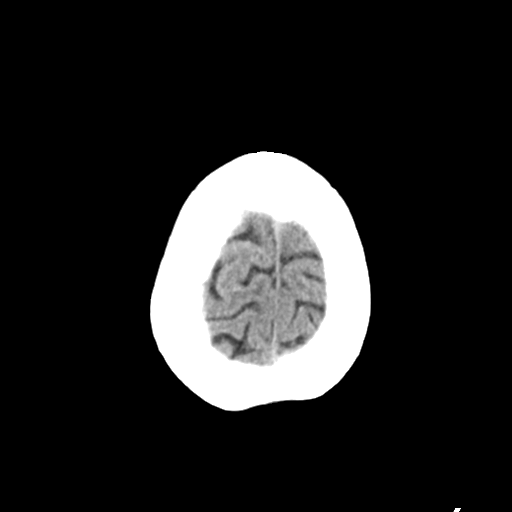
[im 30/32  brain]
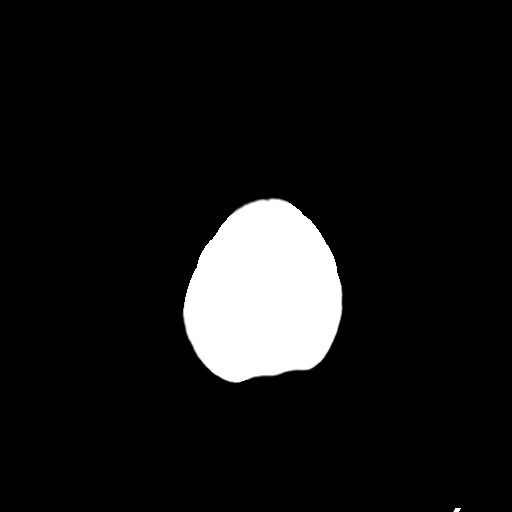

[16 of 30 positions shown; findings below may reference images not displayed]

FINDINGS: Skull and Sinuses:No fracture or destructive process.

Inflammatory mucosal thickening throughout the bilateral paranasal
sinuses with retained secretions in the left sphenoid and right
maxilla.

Orbits: No acute abnormality.

Brain: No evidence of acute infarction, hemorrhage, hydrocephalus,
or mass lesion/mass effect.
IMPRESSION: 1. Negative intracranial imaging.
2. Chronic sinusitis.

## 2016-09-24 ENCOUNTER — Ambulatory Visit (INDEPENDENT_AMBULATORY_CARE_PROVIDER_SITE_OTHER): Payer: BC Managed Care – PPO | Admitting: Physical Therapy

## 2016-09-24 DIAGNOSIS — M545 Low back pain: Secondary | ICD-10-CM | POA: Diagnosis not present

## 2016-09-24 DIAGNOSIS — M6281 Muscle weakness (generalized): Secondary | ICD-10-CM

## 2016-09-24 DIAGNOSIS — M25552 Pain in left hip: Secondary | ICD-10-CM

## 2016-09-24 DIAGNOSIS — G8929 Other chronic pain: Secondary | ICD-10-CM | POA: Diagnosis not present

## 2016-09-24 NOTE — Therapy (Signed)
[place], [place], [address] Phone: [phone]   Fax:  [phone]  Physical Therapy Treatment  Patient Details  Name: FREYJA GOVEA MRN: [id number] Date of Birth: 08-Oct-1955 Referring Provider: Emeterio Reeve, DO  Encounter Date: 09/24/2016      PT End of Session - 09/24/16 1704    Visit Number 2   Number of Visits 12   Date for PT Re-Evaluation 10/26/16   Authorization Type BCBS State $52 copay; 40 visit limit   PT Start Time 1615   PT Stop Time 1656   PT Time Calculation (min) 41 min   Activity Tolerance Patient tolerated treatment well   Behavior During Therapy [facility] for tasks assessed/performed      Past Medical History:  Diagnosis Date  . Depression   . Migraines     Past Surgical History:  Procedure Laterality Date  . CHOLECYSTECTOMY      There were no vitals filed for this visit.      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  feels exercises have been helping and are going well.   Patient Stated Goals improve pain   Currently in Pain? No/denies   Pain Score 7    Pain Location Back   Pain Orientation Left   Pain Descriptors / Indicators Aching;Sharp   Pain Type Chronic pain;Acute pain   Pain Onset More than a month ago   Pain Frequency Intermittent   Aggravating Factors  lifting, carrying objects, weight bearing   Pain Relieving Factors ice, pain meds                         [facility] Adult PT Treatment/Exercise - 09/24/16 1622      Exercises   Exercises Lumbar     Lumbar Exercises: Stretches   Quadruped Mid Back Stretch 3 reps;20 seconds   Quadruped Mid Back Stretch Limitations seated with green physioball   Quad Stretch Left;3 reps;30 seconds   Quad Stretch Limitations supine with hip flexor stretch; PT assisted     Knee/Hip Exercises: Stretches   Hip Flexor  Stretch Left;3 reps;30 seconds   Hip Flexor Stretch Limitations supine with PT assistance     Knee/Hip Exercises: Aerobic   Nustep L5 x 8 min     Manual Therapy   Manual Therapy Soft tissue mobilization;Myofascial release   Manual therapy comments pt sidelying and prone   Soft tissue mobilization Lt TLF and lateral quads; Lt glut med and max; lumbar paraspinals   Myofascial Release Lt TFL and lateral quads; glut med          Trigger Point Dry Needling - 09/24/16 1645    Consent Given? Yes   Education Handout Provided Yes   Muscles Treated Lower Body Tensor fascia lata;Quadriceps  Glut Med & L. multifidi-twitch response   Tensor Fascia Lata Response Twitch response elicited;Palpable increased muscle length  sidelying   Quadriceps Response Twitch response elicited;Palpable increased muscle length  lateral quads                   PT Long Term Goals - 09/14/16 1316      PT LONG TERM GOAL #1   Title independent with HEP   Time 6   Period Weeks   Status New   Target Date 10/26/16     PT LONG TERM GOAL #2  Title improve LLE strength to at least 4/5 for improved function   Time 6   Period Weeks   Status New   Target Date 10/26/16     PT LONG TERM GOAL #3   Title report ability to amb > 30 min without increase in pain for improved functional mobility   Time 6   Period Weeks   Status New   Target Date 10/26/16     PT LONG TERM GOAL #4   Title report ability to return to water exercise with pain < 2/10 for improved function   Time 6   Period Weeks   Status New   Target Date 10/26/16               Plan - 09/24/16 1704    Clinical Impression Statement Pt tolerated DN well today with twitch responses noted in all muscle groups today.  Pt reported 0/10 pain following session today, improved from report of 7/10 pain when arriving at PT.  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   No goals met as only 2nd visit.   PT Treatment/Interventions ADLs/Self Care Home Management;Cryotherapy;Electrical Stimulation;Moist Heat;Iontophoresis 40m/ml Dexamethasone;Ultrasound;Balance training;Therapeutic exercise;Traction;Therapeutic activities;Functional mobility training;Patient/family education;Manual techniques;Dry needling;Taping   PT Next Visit Plan review stretches; modalities, manual and TDN PRN; add strengthening to HEP   Consulted and Agree with Plan of Care Patient      Patient will benefit from skilled therapeutic intervention in order to improve the following deficits and impairments:  Pain, Increased fascial restricitons, Increased muscle spasms, Postural dysfunction, Decreased mobility, Impaired flexibility, Decreased strength, Decreased activity tolerance  Visit Diagnosis: Chronic midline low back pain without sciatica  Pain in left hip  Muscle weakness (generalized)     Problem List There are no active problems to display for this patient.     SLaureen Abrahams PT, DPT 09/24/16 5:07 PM    [facility] [address]Phone: [phone]  Fax:  [phone] Name: BJOVEE DETTINGERMRN: [id number] of Birth: 11/12/1955/04/11

## 2016-10-01 ENCOUNTER — Encounter: Payer: BC Managed Care – PPO | Admitting: Physical Therapy

## 2016-10-07 ENCOUNTER — Ambulatory Visit (INDEPENDENT_AMBULATORY_CARE_PROVIDER_SITE_OTHER): Payer: BC Managed Care – PPO | Admitting: Physical Therapy

## 2016-10-07 DIAGNOSIS — M6281 Muscle weakness (generalized): Secondary | ICD-10-CM | POA: Diagnosis not present

## 2016-10-07 DIAGNOSIS — M545 Low back pain, unspecified: Secondary | ICD-10-CM

## 2016-10-07 DIAGNOSIS — M25552 Pain in left hip: Secondary | ICD-10-CM

## 2016-10-07 DIAGNOSIS — G8929 Other chronic pain: Secondary | ICD-10-CM

## 2016-10-07 NOTE — Therapy (Signed)
[place], [place], [address] Phone: [phone]   Fax:  [phone]  Physical Therapy Treatment  Patient Details  Name: Tammy Carrillo MRN: [id number] Date of Birth: Apr 07, 1955 Referring Provider: Emeterio Reeve, DO  Encounter Date: 10/07/2016      PT End of Session - 10/07/16 1634    Visit Number 3   Number of Visits 12   Date for PT Re-Evaluation 10/26/16   Authorization Type BCBS State $52 copay; 40 visit limit   PT Start Time 1634   PT Stop Time 1700   PT Time Calculation (min) 26 min   Activity Tolerance Patient tolerated treatment well      Past Medical History:  Diagnosis Date  . Depression   . Migraines     Past Surgical History:  Procedure Laterality Date  . CHOLECYSTECTOMY      There were no vitals filed for this visit.      Subjective Assessment - 10/07/16 1634    Subjective Pt reports 3/10 pain in the Lt hip like a tooth ache feeling,  Thinks the DN helped   Patient Stated Goals improve pain   Currently in Pain? Yes   Pain Score 3    Pain Location Hip            [facility] PT Assessment - 10/07/16 0001      Assessment   Medical Diagnosis LBP, Lt hip pain     Strength   Left Hip ABduction 4+/5                     [facility] Adult PT Treatment/Exercise - 10/07/16 0001      Lumbar Exercises: Stretches   ITB Stretch 2 reps;30 seconds  cross body stretch     Manual Therapy   Manual Therapy Soft tissue mobilization;Myofascial release   Manual therapy comments pt sidelying and prone   Soft tissue mobilization Lt TFL and lateral hamstring          Trigger Point Dry Needling - 10/07/16 1656    Consent Given? Yes   Education Handout Provided No   Muscles Treated Lower Body Tensor fascia lata;Hamstring  Lt   Tensor Fascia Lata Response Palpable increased muscle length;Twitch response elicited   Hamstring Response Twitch response  elicited;Palpable increased muscle length                   PT Long Term Goals - 09/14/16 1316      PT LONG TERM GOAL #1   Title independent with HEP   Time 6   Period Weeks   Status New   Target Date 10/26/16     PT LONG TERM GOAL #2   Title improve LLE strength to at least 4/5 for improved function   Time 6   Period Weeks   Status New   Target Date 10/26/16     PT LONG TERM GOAL #3   Title report ability to amb > 30 min without increase in pain for improved functional mobility   Time 6   Period Weeks   Status New   Target Date 10/26/16     PT LONG TERM GOAL #4   Title report ability to return to water exercise with pain < 2/10 for improved function   Time 6   Period Weeks   Status New   Target Date 10/26/16  Plan - 10/07/16 1659    Clinical Impression [place] tolerated treatment really well.  She was tight and tender in only the Lt TFL. Had no pain after treatment and significant less palpable tightness. She is making good progress to her goals.    Rehab Potential Good   PT Frequency 2x / week   PT Duration 6 weeks   PT Treatment/Interventions ADLs/Self Care Home Management;Cryotherapy;Electrical Stimulation;Moist Heat;Iontophoresis 4mg /ml Dexamethasone;Ultrasound;Balance training;Therapeutic exercise;Traction;Therapeutic activities;Functional mobility training;Patient/family education;Manual techniques;Dry needling;Taping   PT Next Visit Plan add in strengthening.    Consulted and Agree with Plan of Care Patient      Patient will benefit from skilled therapeutic intervention in order to improve the following deficits and impairments:  Pain, Increased fascial restricitons, Increased muscle spasms, Postural dysfunction, Decreased mobility, Impaired flexibility, Decreased strength, Decreased activity tolerance  Visit Diagnosis: Chronic midline low back pain without sciatica  Pain in left hip  Muscle weakness  (generalized)     Problem List There are no active problems to display for this patient.   [name] PT 10/07/2016, 5:06 PM  [facility] [place], [place], [address] Phone: [phone]   Fax:  [phone]  Name: Tammy Carrillo MRN: [id number] Date of Birth: 05-21-1955

## 2016-10-08 ENCOUNTER — Encounter: Payer: BC Managed Care – PPO | Admitting: Physical Therapy

## 2016-10-14 ENCOUNTER — Ambulatory Visit (INDEPENDENT_AMBULATORY_CARE_PROVIDER_SITE_OTHER): Payer: BC Managed Care – PPO | Admitting: Physical Therapy

## 2016-10-14 DIAGNOSIS — M545 Low back pain, unspecified: Secondary | ICD-10-CM

## 2016-10-14 DIAGNOSIS — M25552 Pain in left hip: Secondary | ICD-10-CM

## 2016-10-14 DIAGNOSIS — M6281 Muscle weakness (generalized): Secondary | ICD-10-CM | POA: Diagnosis not present

## 2016-10-14 DIAGNOSIS — G8929 Other chronic pain: Secondary | ICD-10-CM

## 2016-10-14 NOTE — Patient Instructions (Addendum)
If pain goes higher than 5-6/10 stop the exercise, rest and try again. If pain continues higher than 6/10 stop for the day and try again the next day.  PELVIC TILT  Lie on back, legs bent. Exhale, tilting top of pelvis back, pubic bone up, to flatten lower back, draw up pelvic floor. Inhale, rolling pelvis opposite way, top forward, pubic bone down. Hold 5 sec. Repeat __10__ times. Do __1__ sessions per day.   Knee Fold   Lie on back, legs bent, arms by sides. Exhale, lifting knee to chest. Inhale, returning. Keep abdominals flat, navel to spine. Alternate legs, slow and controlled.  Repeat __10__ times, alternating legs. Do __1__ sessions per day. Build to 2 sets of 10.   Knee Drop   Keep pelvis stable. Without rotating hips, slowly drop knee to side, pause, return to center, bring knee across midline toward opposite hip. Feel obliques engaging. Repeat for ___10_ times each leg. Build to 2 sets of 10. Once a day.         TENS UNIT: This is helpful for muscle pain and spasm.   Search and Purchase a TENS 7000 2nd edition at www.tenspros.com. It should be less than $30.     TENS unit instructions: Do not shower or bathe with the unit on Turn the unit off before removing electrodes or batteries If the electrodes lose stickiness add a drop of water to the electrodes after they are disconnected from the unit and place on plastic sheet. If you continued to have difficulty, call the TENS unit company to purchase more electrodes. Do not apply lotion on the skin area prior to use. Make sure the skin is clean and dry as this will help prolong the life of the electrodes. After use, always check skin for unusual red areas, rash or other skin difficulties. If there are any skin problems, does not apply electrodes to the same area. Never remove the electrodes from the unit by pulling the wires. Do not use the TENS unit or electrodes other than as directed. Do not change electrode placement  without consultating your therapist or physician. Keep 2 fingers with between each electrode. Wear time ratio is 2:1, on to off times.    For example on for 30 minutes off for 15 minutes and then on for 30 minutes off for 15 minutes

## 2016-10-14 NOTE — Therapy (Addendum)
[place], [place], [address] Phone: [phone]   Fax:  [phone]  Physical Therapy Treatment  Patient Details  Name: Tammy Carrillo MRN: [id number] Date of Birth: December 10, 1955 Referring Provider: Emeterio Reeve, DO  Encounter Date: 10/14/2016      PT End of Session - 10/14/16 1608    Visit Number 4   Number of Visits 12   Date for PT Re-Evaluation 10/26/16   Authorization Type [employer] State $52 copay; 40 visit limit   PT Start Time 1608   PT Stop Time 1701   PT Time Calculation (min) 53 min   Activity Tolerance Patient tolerated treatment well      Past Medical History:  Diagnosis Date  . Depression   . Migraines     Past Surgical History:  Procedure Laterality Date  . CHOLECYSTECTOMY      There were no vitals filed for this visit.      Subjective Assessment - 10/14/16 1609    Subjective Pt reports her hip is good, however her low back is really bad.  She states that when her low back pain is bad she will become incontenent and have vaginal bleeding. Her MD has screened her and not seen anything   Pertinent History depression, migraines   Patient Stated Goals improve pain   Currently in Pain? Yes   Pain Score 4   last night was 8/10   Pain Location Back   Pain Orientation Left   Pain Descriptors / Indicators Sharp;Aching   Pain Type Chronic pain   Pain Radiating Towards point tender in one spot   Pain Onset More than a month ago   Pain Frequency Constant   Aggravating Factors  not sure   Pain Relieving Factors gentle movement.                         [facility] Adult PT Treatment/Exercise - 10/14/16 0001      Self-Care   Self-Care Other Self-Care Comments   Other Self-Care Comments  self trigger point release to Lt gluts and low back using a ball.      Modalities   Modalities Electrical Stimulation;Moist Heat     Moist Heat Therapy   Number Minutes  Moist Heat 15 Minutes   Moist Heat Location Lumbar Spine     Electrical Stimulation   Electrical Stimulation Location Lt lumbar and gluts   Electrical Stimulation Action modulation   Electrical Stimulation Parameters to tolerance   Electrical Stimulation Goals Pain;Tone     Manual Therapy   Manual Therapy Soft tissue mobilization;Myofascial release;Joint mobilization   Manual therapy comments pt prone   Joint Mobilization lumbar CPA and bilat UPA and sacral CPA grade III   Soft tissue mobilization Lt gluts and lumbar paraspinals STM          Trigger Point Dry Needling - 10/14/16 1640    Consent Given? Yes   Education Handout Provided No   Muscles Treated Lower Body Gluteus maximus   Gluteus Maximus Response Palpable increased muscle length;Twitch response elicited  Lt              PT Education - 10/14/16 1659    Education provided Yes   Education Details HEP TA work and home TENS unit   Person(s) Educated Patient   Methods Demonstration;Explanation;Handout   Comprehension Returned demonstration;Verbalized understanding             PT Long  Term Goals - 10/14/16 1656      PT LONG TERM GOAL #1   Title independent with HEP   Status On-going     PT LONG TERM GOAL #2   Title improve LLE strength to at least 4/5 for improved function   Status On-going     PT LONG TERM GOAL #3   Title report ability to amb > 30 min without increase in pain for improved functional mobility   Status On-going  progressing     PT LONG TERM GOAL #4   Title report ability to return to water exercise with pain < 2/10 for improved function   Status On-going               Plan - 10/14/16 1657    Clinical Impression Statement Tammy Carrillo's pain as been moving around, good thing is that the areas sore are different from the areas that have been treated.  Once concern is the urinary incontenence and bleeding that come with low back pain.  She may benefit from a visit or two with  our pelvic pain therapists if this continues.  Added in deep core ex today for the TA and pelvic floor.     Rehab Potential Good   PT Frequency 2x / week   PT Duration 6 weeks   PT Treatment/Interventions ADLs/Self Care Home Management;Cryotherapy;Electrical Stimulation;Moist Heat;Iontophoresis 35m/ml Dexamethasone;Ultrasound;Balance training;Therapeutic exercise;Traction;Therapeutic activities;Functional mobility training;Patient/family education;Manual techniques;Dry needling;Taping   PT Next Visit Plan progress core work, manual therapy PRN   Consulted and Agree with Plan of Care Patient      Patient will benefit from skilled therapeutic intervention in order to improve the following deficits and impairments:  Pain, Increased fascial restricitons, Increased muscle spasms, Postural dysfunction, Decreased mobility, Impaired flexibility, Decreased strength, Decreased activity tolerance  Visit Diagnosis: Pain in left hip  Muscle weakness (generalized)  Chronic midline low back pain without sciatica     Problem List There are no active problems to display for this patient.   SJeral PinchPT  10/14/2016, 5:00 PM  [facility] [address] [address]Phone: [phone]  Fax:  [phone] Name: Tammy LONGSWORTHMRN: [id number] of Birth: 1Dec 31, 1957  PHYSICAL THERAPY DISCHARGE SUMMARY  Visits from Start of Care: 3 Current functional level related to goals / functional outcomes: Unknown, she has not returned   Remaining deficits: Unknown, at last visit her hip had improved, back still bothering her   Education / Equipment: HEP Plan:                                                    Patient goals were not met. Patient is being discharged due to not returning since the last visit.  ?????Discussed possibly seeing PT that specializes in pelvic care/pain.     SJeral Pinch PT 12/09/16 10:53  AM

## 2016-10-21 ENCOUNTER — Encounter: Payer: BC Managed Care – PPO | Admitting: Physical Therapy

## 2016-10-23 ENCOUNTER — Emergency Department (INDEPENDENT_AMBULATORY_CARE_PROVIDER_SITE_OTHER)
Admission: EM | Admit: 2016-10-23 | Discharge: 2016-10-23 | Disposition: A | Discharge status: Home / Self Care | Payer: BC Managed Care – PPO | Source: Home / Self Care | Attending: Family Medicine | Admitting: Family Medicine

## 2016-10-23 ENCOUNTER — Encounter: Payer: Self-pay | Admitting: *Deleted

## 2016-10-23 DIAGNOSIS — J019 Acute sinusitis, unspecified: Secondary | ICD-10-CM

## 2016-10-23 DIAGNOSIS — J9801 Acute bronchospasm: Secondary | ICD-10-CM

## 2016-10-23 LAB — POCT RAPID STREP A (OFFICE): Rapid Strep A Screen: NEGATIVE

## 2016-10-23 MED ORDER — PREDNISONE 20 MG PO TABS: ORAL_TABLET | ORAL | 0 refills | Status: DC

## 2016-10-23 MED ORDER — ALBUTEROL SULFATE HFA 108 (90 BASE) MCG/ACT IN AERS: 2.0000 | INHALATION_SPRAY | RESPIRATORY_TRACT | 1 refills | Status: DC | PRN

## 2016-10-23 MED ORDER — METHYLPREDNISOLONE SODIUM SUCC 125 MG IJ SOLR
80.0000 mg | Freq: Once | INTRAMUSCULAR | Status: AC
  Administered 2016-10-23: 80 mg via INTRAMUSCULAR

## 2016-10-23 MED ORDER — DOXYCYCLINE HYCLATE 100 MG PO CAPS: 100.0000 mg | ORAL_CAPSULE | Freq: Two times a day (BID) | ORAL | 0 refills | Status: DC

## 2016-10-23 NOTE — ED Triage Notes (Signed)
Patient c/o 2 weeks of congestion. 2 days of sore throat and HA. Temp 100 this AM. Taken Afrin and IBF otc.

## 2016-10-23 NOTE — Discharge Instructions (Signed)
Begin prednisone Saturday 10/24/16. Take plain guaifenesin (1200mg  extended release tabs such as Mucinex) twice daily, with plenty of water, for cough and congestion.  Continue Pseudoephedrine for sinus congestion.  Get adequate rest.   May use Afrin nasal spray (or generic oxymetazoline) each morning for about 5 days and then discontinue.  Also recommend using saline nasal spray several times daily and saline nasal irrigation (AYR is a common brand).  Use Flonase nasal spray each morning after using Afrin nasal spray and saline nasal irrigation. Try warm salt water gargles for sore throat.  Stop all antihistamines for now, and other non-prescription cough/cold preparations. May take Delsym Cough Suppressant at bedtime for nighttime cough.  Continue albuterol inhaler as needed.

## 2016-10-23 NOTE — ED Provider Notes (Signed)
[facility]    CSN: [id number] Arrival date & time: 10/23/16  1102     History   Chief Complaint Chief Complaint  Patient presents with  . Sore Throat  . Headache    HPI Tammy Carrillo is a 61 y.o. female.   Patient complains of onset of mild nasal congestion about three weeks ago, but did not feel ill.  Two weeks ago she developed a non-productive cough that has persisted.  Yesterday she developed a sore throat, myalgias, increased fatigue, and chills.  She has had increased wheezing and has had to use her albuterol inhaler more frequently.  She feels tightness in her anterior chest.  She has a history of well controlled asthma.   The history is provided by the patient.    Past Medical History:  Diagnosis Date  . Depression   . Migraines     There are no active problems to display for this patient.   Past Surgical History:  Procedure Laterality Date  . CHOLECYSTECTOMY      OB History    No data available       Home Medications    Prior to Admission medications   Medication Sig Start Date End Date Taking? Authorizing Provider  albuterol (PROVENTIL HFA;VENTOLIN HFA) 108 (90 Base) MCG/ACT inhaler Inhale 2 puffs into the lungs every 4 (four) hours as needed for wheezing or shortness of breath. 10/23/16   Kandra Nicolas, MD  diclofenac sodium (VOLTAREN) 1 % GEL Apply 2-4 g topically 4 (four) times daily. To affected joint/low back 09/03/16   Emeterio Reeve, DO  doxycycline (VIBRAMYCIN) 100 MG capsule Take 1 capsule (100 mg total) by mouth 2 (two) times daily. Take with food. 10/23/16   Kandra Nicolas, MD  Naproxen-Esomeprazole 500-20 MG TBEC Take 1 tablet by mouth 2 (two) times daily. 09/03/16   Emeterio Reeve, DO  predniSONE (DELTASONE) 20 MG tablet Take one tab by mouth twice daily for 5 days, then one daily for 3 days. Take with food. 10/23/16   Kandra Nicolas, MD  SUMAtriptan (IMITREX) 50 MG tablet Take 1 tablet (50 mg total) by mouth every  2 (two) hours as needed for migraine. Repeat in 2 hours if headache persists. Max use 2 days/week. 09/03/16   Emeterio Reeve, DO  venlafaxine XR (EFFEXOR-XR) 150 MG 24 hr capsule Take 150 mg by mouth daily.    [provider]    Family History Family History  Problem Relation Age of Onset  . Cancer Father   . Stroke Father   . Diabetes Father   . Cancer Sister        breast  . Asthma Mother   . Stroke Maternal Grandmother   . Heart attack Paternal Grandmother     Social History Social History  Substance Use Topics  . Smoking status: Former Smoker    Packs/day: 0.50    Years: 5.00    Types: Cigarettes    Quit date: 01/26/1985  . Smokeless tobacco: Never Used  . Alcohol use No     Allergies   Patient has no known allergies.   Review of Systems Review of Systems  + sore throat + cough No pleuritic pain, but feels tight in anterior chest. +wheezing + nasal congestion + post-nasal drainage No sinus pain/pressure No itchy/red eyes No earache No hemoptysis + SOB + fever, + chills No nausea No vomiting No abdominal pain No diarrhea No urinary symptoms No skin rash + fatigue + myalgias +  headache Used OTC meds without relief    Physical Exam Triage Vital Signs ED Triage Vitals [10/23/16 1122]  Enc Vitals Group     BP (!) 146/83     Pulse Rate 87     Resp 14     Temp 99 F (37.2 C)     Temp Source Oral     SpO2 99 %     Weight 193 lb (87.5 kg)     Height      Head Circumference      Peak Flow      Pain Score 6     Pain Loc      Pain Edu?      Excl. in [place]?    No data found.   Updated Vital Signs BP (!) 146/83 (BP Location: Left Arm)   Pulse 87   Temp 99 F (37.2 C) (Oral)   Resp 14   Wt 193 lb (87.5 kg)   SpO2 99%   BMI 30.23 kg/m   Visual Acuity Right Eye Distance:   Left Eye Distance:   Bilateral Distance:    Right Eye Near:   Left Eye Near:    Bilateral Near:     Physical Exam Nursing notes and Vital Signs  reviewed. Appearance:  Patient appears stated age, and in no acute distress Eyes:  Pupils are equal, round, and reactive to light and accomodation.  Extraocular movement is intact.  Conjunctivae are not inflamed  Ears:  Canals normal.  Tympanic membranes normal.  Nose:  Congested turbinates.  Mild maxillary sinus tenderness is present.  Pharynx:  Normal Neck:  Supple.  Enlarged posterior/lateral nodes are palpated bilaterally, tender to palpation on the left.   Lungs:  Faint bibasilar posterior expiratory wheezes heard.  Breath sounds are equal.  Moving air well. Heart:  Regular rate and rhythm without murmurs, rubs, or gallops.  Abdomen:  Nontender without masses or hepatosplenomegaly.  Bowel sounds are present.  No CVA or flank tenderness.  Extremities:  No edema.  Skin:  No rash present.    UC Treatments / Results  Labs (all labs ordered are listed, but only abnormal results are displayed) Labs Reviewed  POCT RAPID STREP A (OFFICE) negative    EKG  EKG Interpretation None       Radiology No results found.  Procedures Procedures (including critical care time)  Medications Ordered in UC Medications  methylPREDNISolone sodium succinate (SOLU-MEDROL) 125 mg/2 mL injection 80 mg (80 mg Intramuscular Given 10/23/16 1208)     Initial Impression / Assessment and Plan / UC Course  I have reviewed the triage vital signs and the nursing notes.  Pertinent labs & imaging results that were available during my care of the patient were reviewed by me and considered in my medical decision making (see chart for details).    Administered Solumedrol 80mg  IM Begin doxycycline 100mg  BID for atypical coverage Begin prednisone Saturday 10/24/16.  Refill albuterol inhaler. Take plain guaifenesin (1200mg  extended release tabs such as Mucinex) twice daily, with plenty of water, for cough and congestion.  Continue Pseudoephedrine for sinus congestion.  Get adequate rest.   May use Afrin nasal  spray (or generic oxymetazoline) each morning for about 5 days and then discontinue.  Also recommend using saline nasal spray several times daily and saline nasal irrigation (AYR is a common brand).  Use Flonase nasal spray each morning after using Afrin nasal spray and saline nasal irrigation. Try warm salt water gargles for sore throat.  Stop all antihistamines for now, and other non-prescription cough/cold preparations. May take Delsym Cough Suppressant at bedtime for nighttime cough.  Continue albuterol inhaler as needed.    Final Clinical Impressions(s) / UC Diagnoses   Final diagnoses:  Acute rhinosinusitis  Bronchospasm, acute    New Prescriptions New Prescriptions   ALBUTEROL (PROVENTIL HFA;VENTOLIN HFA) 108 (90 BASE) MCG/ACT INHALER    Inhale 2 puffs into the lungs every 4 (four) hours as needed for wheezing or shortness of breath.   DOXYCYCLINE (VIBRAMYCIN) 100 MG CAPSULE    Take 1 capsule (100 mg total) by mouth 2 (two) times daily. Take with food.   PREDNISONE (DELTASONE) 20 MG TABLET    Take one tab by mouth twice daily for 5 days, then one daily for 3 days. Take with food.         Kandra Nicolas, MD 10/23/16 1215

## 2016-11-28 ENCOUNTER — Emergency Department (INDEPENDENT_AMBULATORY_CARE_PROVIDER_SITE_OTHER)
Admission: EM | Admit: 2016-11-28 | Discharge: 2016-11-28 | Disposition: A | Discharge status: Home / Self Care | Payer: BC Managed Care – PPO | Source: Home / Self Care | Attending: Family Medicine | Admitting: Family Medicine

## 2016-11-28 ENCOUNTER — Encounter: Payer: Self-pay | Admitting: Emergency Medicine

## 2016-11-28 DIAGNOSIS — B9689 Other specified bacterial agents as the cause of diseases classified elsewhere: Secondary | ICD-10-CM

## 2016-11-28 DIAGNOSIS — J208 Acute bronchitis due to other specified organisms: Secondary | ICD-10-CM | POA: Diagnosis not present

## 2016-11-28 DIAGNOSIS — R0989 Other specified symptoms and signs involving the circulatory and respiratory systems: Secondary | ICD-10-CM

## 2016-11-28 MED ORDER — AMOXICILLIN-POT CLAVULANATE 875-125 MG PO TABS: 1.0000 | ORAL_TABLET | Freq: Two times a day (BID) | ORAL | 0 refills | Status: DC

## 2016-11-28 MED ORDER — PREDNISONE 20 MG PO TABS: ORAL_TABLET | ORAL | 0 refills | Status: DC

## 2016-11-28 MED ORDER — METHYLPREDNISOLONE SODIUM SUCC 40 MG IJ SOLR
80.0000 mg | Freq: Once | INTRAMUSCULAR | Status: AC
  Administered 2016-11-28: 80 mg via INTRAMUSCULAR

## 2016-11-28 MED ORDER — IPRATROPIUM BROMIDE 0.06 % NA SOLN: 2.0000 | Freq: Four times a day (QID) | NASAL | 1 refills | Status: DC

## 2016-11-28 NOTE — ED Triage Notes (Signed)
Pt c/o sore throat, sinus pressure and congestion x2 weeks. She has been taking mucinex and vitC.

## 2016-11-28 NOTE — ED Provider Notes (Signed)
[facility]    CSN: [id number] Arrival date & time: 11/28/16  1449     History   Chief Complaint Chief Complaint  Patient presents with  . Nasal Congestion    HPI Tammy Carrillo is a 61 y.o. female.   HPI Tammy Carrillo is a 61 y.o. female presenting to UC with c/o 2 weeks of worsening sore throat, sinus congestion with facial pain and pressure, and nonproductive worsening cough that is unrelieved by her albuterol inhaler.  She has taken Mucinex and Vitamin C w/o relief.  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  She has not received the flu vaccine this year as she states she has not felt well enough to get one.    Past Medical History:  Diagnosis Date  . Depression   . Migraines     There are no active problems to display for this patient.   Past Surgical History:  Procedure Laterality Date  . CHOLECYSTECTOMY      OB History    No data available       Home Medications    Prior to Admission medications   Medication Sig Start Date End Date Taking? Authorizing Provider  albuterol (PROVENTIL HFA;VENTOLIN HFA) 108 (90 Base) MCG/ACT inhaler Inhale 2 puffs into the lungs every 4 (four) hours as needed for wheezing or shortness of breath. 10/23/16   Kandra Nicolas, MD  amoxicillin-clavulanate (AUGMENTIN) 875-125 MG tablet Take 1 tablet by mouth 2 (two) times daily. One po bid x 7 days 11/28/16   Noe Gens, PA-C  diclofenac sodium (VOLTAREN) 1 % GEL Apply 2-4 g topically 4 (four) times daily. To affected joint/low back 09/03/16   Emeterio Reeve, DO  doxycycline (VIBRAMYCIN) 100 MG capsule Take 1 capsule (100 mg total) by mouth 2 (two) times daily. Take with food. 10/23/16   Kandra Nicolas, MD  ipratropium (ATROVENT) 0.06 % nasal spray Place 2 sprays into both nostrils 4 (four) times daily. 11/28/16   Noe Gens, PA-C  Naproxen-Esomeprazole 500-20 MG TBEC Take 1 tablet by mouth 2 (two) times daily. 09/03/16   Emeterio Reeve,  DO  predniSONE (DELTASONE) 20 MG tablet 3 tabs po day one, then 2 po daily x 4 days 11/28/16   Noe Gens, PA-C  SUMAtriptan (IMITREX) 50 MG tablet Take 1 tablet (50 mg total) by mouth every 2 (two) hours as needed for migraine. Repeat in 2 hours if headache persists. Max use 2 days/week. 09/03/16   Emeterio Reeve, DO  venlafaxine XR (EFFEXOR-XR) 150 MG 24 hr capsule Take 150 mg by mouth daily.    [provider]    Family History Family History  Problem Relation Age of Onset  . Cancer Father   . Stroke Father   . Diabetes Father   . Cancer Sister        breast  . Asthma Mother   . Stroke Maternal Grandmother   . Heart attack Paternal Grandmother     Social History Social History  Substance Use Topics  . Smoking status: Former Smoker    Packs/day: 0.50    Years: 5.00    Types: Cigarettes    Quit date: 01/26/1985  . Smokeless tobacco: Never Used  . Alcohol use No     Allergies   Patient has no known allergies.   Review of Systems Review of Systems  Constitutional: Positive for fatigue. Negative for chills and fever.  HENT: Positive for congestion, sinus pain,  sinus pressure and sore throat. Negative for ear pain, trouble swallowing and voice change.   Respiratory: Positive for cough. Negative for shortness of breath.   Cardiovascular: Negative for chest pain and palpitations.  Gastrointestinal: Negative for abdominal pain, diarrhea, nausea and vomiting.  Musculoskeletal: Positive for arthralgias and myalgias. Negative for back pain.       Body aches  Skin: Negative for rash.  Neurological: Positive for headaches. Negative for dizziness and light-headedness.     Physical Exam Triage Vital Signs ED Triage Vitals  Enc Vitals Group     BP 11/28/16 1506 (!) 140/95     Pulse Rate 11/28/16 1506 100     Resp --      Temp 11/28/16 1506 98.3 F (36.8 C)     Temp Source 11/28/16 1506 Oral     SpO2 11/28/16 1506 96 %     Weight 11/28/16 1506 186 lb (84.4  kg)     Height --      Head Circumference --      Peak Flow --      Pain Score 11/28/16 1507 0     Pain Loc --      Pain Edu? --      Excl. in [place]? --    No data found.   Updated Vital Signs BP (!) 140/95 (BP Location: Right Arm)   Pulse 100   Temp 98.3 F (36.8 C) (Oral)   Wt 186 lb (84.4 kg)   SpO2 96%   BMI 29.13 kg/m   Visual Acuity Right Eye Distance:   Left Eye Distance:   Bilateral Distance:    Right Eye Near:   Left Eye Near:    Bilateral Near:     Physical Exam  Constitutional: She is oriented to person, place, and time. She appears well-developed and well-nourished. No distress.  HENT:  Head: Normocephalic and atraumatic.  Right Ear: Tympanic membrane normal.  Left Ear: Tympanic membrane normal.  Nose: Mucosal edema present. Right sinus exhibits no maxillary sinus tenderness and no frontal sinus tenderness. Left sinus exhibits no maxillary sinus tenderness and no frontal sinus tenderness.  Mouth/Throat: Uvula is midline, oropharynx is clear and moist and mucous membranes are normal.  Eyes: EOM are normal.  Neck: Normal range of motion. Neck supple.  Cardiovascular: Normal rate.   Pulmonary/Chest: Effort normal. No stridor. No respiratory distress. She has wheezes. She has rales.  Wheeze and rales with rhonchi in Left lung fields. No respiratory distress. Dry cough on exam.  Musculoskeletal: Normal range of motion.  Lymphadenopathy:    She has no cervical adenopathy.  Neurological: She is alert and oriented to person, place, and time.  Skin: Skin is warm and dry. She is not diaphoretic.  Psychiatric: She has a normal mood and affect. Her behavior is normal.  Nursing note and vitals reviewed.    UC Treatments / Results  Labs (all labs ordered are listed, but only abnormal results are displayed) Labs Reviewed - No data to display  EKG  EKG Interpretation None       Radiology No results found.  Procedures Procedures (including critical care  time)  Medications Ordered in UC Medications  methylPREDNISolone sodium succinate (SOLU-MEDROL) 40 mg/mL injection 80 mg (80 mg Intramuscular Given 11/28/16 1531)     Initial Impression / Assessment and Plan / UC Course  I have reviewed the triage vital signs and the nursing notes.  Pertinent labs & imaging results that were available during my care of the  patient were reviewed by me and considered in my medical decision making (see chart for details).     With worsening URI symptoms and abnormal lung sounds in Left lung field, concern for pneumonia. Will start on Augmentin, pt states Azithromycin "doesn't work"  Pt has been on doxycycline almost every 2-3 months for the last year, will try a different antibiotic Encouraged fluids and rest F/u with PCP next week if not improving Encouraged f/u with ENT, even if improving, due to recurrent sinus infections and symptoms.   Final Clinical Impressions(s) / UC Diagnoses   Final diagnoses:  Acute bacterial bronchitis  Rhonchi at left lung base    New Prescriptions Discharge Medication List as of 11/28/2016  3:26 PM    START taking these medications   Details  amoxicillin-clavulanate (AUGMENTIN) 875-125 MG tablet Take 1 tablet by mouth 2 (two) times daily. One po bid x 7 days, Starting Sat 11/28/2016, Normal    ipratropium (ATROVENT) 0.06 % nasal spray Place 2 sprays into both nostrils 4 (four) times daily., Starting Sat 11/28/2016, Normal         Controlled Substance Prescriptions [place] Controlled Substance Registry consulted? Not Applicable   Tyrell Antonio 11/28/16 1559

## 2017-01-02 ENCOUNTER — Emergency Department (INDEPENDENT_AMBULATORY_CARE_PROVIDER_SITE_OTHER)
Admission: EM | Admit: 2017-01-02 | Discharge: 2017-01-02 | Disposition: A | Discharge status: Home / Self Care | Payer: BC Managed Care – PPO | Source: Home / Self Care | Attending: Family Medicine | Admitting: Family Medicine

## 2017-01-02 ENCOUNTER — Other Ambulatory Visit: Payer: Self-pay

## 2017-01-02 ENCOUNTER — Emergency Department (INDEPENDENT_AMBULATORY_CARE_PROVIDER_SITE_OTHER): Payer: BC Managed Care – PPO

## 2017-01-02 DIAGNOSIS — R079 Chest pain, unspecified: Secondary | ICD-10-CM | POA: Diagnosis not present

## 2017-01-02 DIAGNOSIS — R0602 Shortness of breath: Secondary | ICD-10-CM | POA: Diagnosis not present

## 2017-01-02 DIAGNOSIS — R05 Cough: Secondary | ICD-10-CM | POA: Diagnosis not present

## 2017-01-02 DIAGNOSIS — B9789 Other viral agents as the cause of diseases classified elsewhere: Secondary | ICD-10-CM

## 2017-01-02 DIAGNOSIS — J069 Acute upper respiratory infection, unspecified: Secondary | ICD-10-CM

## 2017-01-02 MED ORDER — GUAIFENESIN ER 600 MG PO TB12: 1200.0000 mg | ORAL_TABLET | Freq: Two times a day (BID) | ORAL | 0 refills | Status: DC

## 2017-01-02 MED ORDER — CEFDINIR 300 MG PO CAPS: 300.0000 mg | ORAL_CAPSULE | Freq: Two times a day (BID) | ORAL | 0 refills | Status: DC

## 2017-01-02 NOTE — ED Provider Notes (Signed)
[facility]    CSN: [id number] Arrival date & time: 01/02/17  1510     History   Chief Complaint Chief Complaint  Patient presents with  . Cough    HPI Tammy Carrillo is a 61 y.o. female.   Patient complains of onset of burning sensation in her anterior chest 5 days ago, followed by sinus congestion, fatigue, chills/sweats, productive cough, and myalgias.  Her chills/sweats have persisted.   The history is provided by the patient.    Past Medical History:  Diagnosis Date  . Depression   . Migraines     There are no active problems to display for this patient.   Past Surgical History:  Procedure Laterality Date  . CHOLECYSTECTOMY      OB History    No data available       Home Medications    Prior to Admission medications   Medication Sig Start Date End Date Taking? Authorizing Provider  albuterol (PROVENTIL HFA;VENTOLIN HFA) 108 (90 Base) MCG/ACT inhaler Inhale 2 puffs into the lungs every 4 (four) hours as needed for wheezing or shortness of breath. 10/23/16   Kandra Nicolas, MD  cefdinir (OMNICEF) 300 MG capsule Take 1 capsule (300 mg total) by mouth 2 (two) times daily. 01/02/17   Kandra Nicolas, MD  diclofenac sodium (VOLTAREN) 1 % GEL Apply 2-4 g topically 4 (four) times daily. To affected joint/low back 09/03/16   Emeterio Reeve, DO  ipratropium (ATROVENT) 0.06 % nasal spray Place 2 sprays into both nostrils 4 (four) times daily. 11/28/16   Noe Gens, PA-C  Naproxen-Esomeprazole 500-20 MG TBEC Take 1 tablet by mouth 2 (two) times daily. 09/03/16   Emeterio Reeve, DO  predniSONE (DELTASONE) 20 MG tablet 3 tabs po day one, then 2 po daily x 4 days 11/28/16   Noe Gens, PA-C  SUMAtriptan (IMITREX) 50 MG tablet Take 1 tablet (50 mg total) by mouth every 2 (two) hours as needed for migraine. Repeat in 2 hours if headache persists. Max use 2 days/week. 09/03/16   Emeterio Reeve, DO  venlafaxine XR (EFFEXOR-XR) 150 MG 24 hr capsule  Take 150 mg by mouth daily.    [provider]    Family History Family History  Problem Relation Age of Onset  . Cancer Father   . Stroke Father   . Diabetes Father   . Cancer Sister        breast  . Asthma Mother   . Stroke Maternal Grandmother   . Heart attack Paternal Grandmother     Social History Social History   Tobacco Use  . Smoking status: Former Smoker    Packs/day: 0.50    Years: 5.00    Pack years: 2.50    Types: Cigarettes    Last attempt to quit: 01/26/1985    Years since quitting: 31.9  . Smokeless tobacco: Never Used  Substance Use Topics  . Alcohol use: No  . Drug use: No     Allergies   Patient has no known allergies.   Review of Systems Review of Systems ? sore throat + cough No pleuritic pain No wheezing + nasal congestion + post-nasal drainage No sinus pain/pressure No itchy/red eyes No earache No hemoptysis No SOB No fever, + chills/sweats No nausea No vomiting No abdominal pain No diarrhea No urinary symptoms No skin rash + fatigue + myalgias No headache Used OTC meds without relief   Physical Exam Triage Vital Signs ED Triage Vitals  Enc Vitals Group     BP 01/02/17 1527 128/85     Pulse Rate 01/02/17 1527 94     Resp 01/02/17 1527 16     Temp 01/02/17 1527 98.2 F (36.8 C)     Temp Source 01/02/17 1527 Oral     SpO2 01/02/17 1527 97 %     Weight 01/02/17 1527 185 lb (83.9 kg)     Height 01/02/17 1527 5\' 7"  (1.702 m)     Head Circumference --      Peak Flow --      Pain Score 01/02/17 1528 0     Pain Loc --      Pain Edu? --      Excl. in [place]? --    No data found.  Updated Vital Signs BP 128/85 (BP Location: Right Arm)   Pulse 94   Temp 98.2 F (36.8 C) (Oral)   Resp 16   Ht 5\' 7"  (1.702 m)   Wt 185 lb (83.9 kg)   SpO2 97%   BMI 28.98 kg/m   Visual Acuity Right Eye Distance:   Left Eye Distance:   Bilateral Distance:    Right Eye Near:   Left Eye Near:    Bilateral Near:      Physical Exam Nursing notes and Vital Signs reviewed. Appearance:  Patient appears stated age, and in no acute distress Eyes:  Pupils are equal, round, and reactive to light and accomodation.  Extraocular movement is intact.  Conjunctivae are not inflamed  Ears:  Canals normal.  Tympanic membranes normal.  Nose:  Mildly congested turbinates.  No sinus tenderness.   Pharynx:  Normal Neck:  Supple.  Enlarged posterior/lateral nodes are palpated bilaterally, tender to palpation on the left.   Lungs:  Course breath sounds.  Breath sounds are equal.  Moving air well. Heart:  Regular rate and rhythm without murmurs, rubs, or gallops.  Abdomen:  Nontender without masses or hepatosplenomegaly.  Bowel sounds are present.  No CVA or flank tenderness.  Extremities:  No edema.  Skin:  No rash present.    UC Treatments / Results  Labs (all labs ordered are listed, but only abnormal results are displayed) Labs Reviewed - No data to display  EKG  EKG Interpretation None       Radiology Dg Chest 2 View  Result Date: 01/02/2017 CLINICAL DATA:  Productive cough. Shortness breath. Left-sided chest pain. EXAM: CHEST  2 VIEW COMPARISON:  Two-view chest x-ray 02/13/2015 FINDINGS: Heart size is normal. Lungs are clear. Surgical clips are present at the gallbladder fossa. IMPRESSION: Negative two view chest x-ray Electronically Signed   By: San Morelle M.D.   On: 01/02/2017 15:39    Procedures Procedures (including critical care time)  Medications Ordered in UC Medications - No data to display   Initial Impression / Assessment and Plan / UC Course  I have reviewed the triage vital signs and the nursing notes.  Pertinent labs & imaging results that were available during my care of the patient were reviewed by me and considered in my medical decision making (see chart for details).    Begin Omnicef 300mg  BID. Patient has left-over prednisone at home:  Take prednisone 20mg  as follows  (begin Sunday 01/03/17):  one tab twice daily for 3 days, then one daily for two days.  Take plain guaifenesin (1200mg  extended release tabs such as Mucinex) twice daily, with plenty of water, for cough and congestion.  May add Pseudoephedrine (30mg , one  or two every 4 to 6 hours) for sinus congestion.  Get adequate rest.   May use Afrin nasal spray (or generic oxymetazoline) each morning for about 5 days and then discontinue.  Also recommend using saline nasal spray several times daily and saline nasal irrigation (AYR is a common brand).  Use Flonase nasal spray each morning after using Afrin nasal spray and saline nasal irrigation. Try warm salt water gargles for sore throat.  Stop all antihistamines for now, and other non-prescription cough/cold preparations. Continue Tessalon perles at bedtime for cough at night.  Continue albuterol inhaler as needed. Follow-up with family doctor if not improving about 7 to 10 days.     Final Clinical Impressions(s) / UC Diagnoses   Final diagnoses:  Viral URI with cough    ED Discharge Orders        Ordered    cefdinir (OMNICEF) 300 MG capsule  2 times daily     01/02/17 1621           Kandra Nicolas, MD 01/05/17 [phone]

## 2017-01-02 NOTE — Discharge Instructions (Signed)
Take prednisone 20mg  as follows (begin Sunday 01/03/17):  one tab twice daily for 3 days, then one daily for two days.  Take plain guaifenesin (1200mg  extended release tabs such as Mucinex) twice daily, with plenty of water, for cough and congestion.  May add Pseudoephedrine (30mg , one or two every 4 to 6 hours) for sinus congestion.  Get adequate rest.   May use Afrin nasal spray (or generic oxymetazoline) each morning for about 5 days and then discontinue.  Also recommend using saline nasal spray several times daily and saline nasal irrigation (AYR is a common brand).  Use Flonase nasal spray each morning after using Afrin nasal spray and saline nasal irrigation. Try warm salt water gargles for sore throat.  Stop all antihistamines for now, and other non-prescription cough/cold preparations. Continue Tessalon perles at bedtime for cough at night.  Continue albuterol inhaler as needed. Follow-up with family doctor if not improving about 7 to 10 days.

## 2017-01-02 NOTE — ED Triage Notes (Signed)
C/o chest congestion, fever and coughing up [place] Clots x 6 days

## 2017-02-23 ENCOUNTER — Encounter: Payer: Self-pay | Admitting: Emergency Medicine

## 2017-02-23 ENCOUNTER — Emergency Department (INDEPENDENT_AMBULATORY_CARE_PROVIDER_SITE_OTHER)
Admission: EM | Admit: 2017-02-23 | Discharge: 2017-02-23 | Disposition: A | Discharge status: Home / Self Care | Payer: BC Managed Care – PPO | Source: Home / Self Care | Attending: Family Medicine | Admitting: Family Medicine

## 2017-02-23 ENCOUNTER — Other Ambulatory Visit: Payer: Self-pay

## 2017-02-23 DIAGNOSIS — J111 Influenza due to unidentified influenza virus with other respiratory manifestations: Secondary | ICD-10-CM

## 2017-02-23 DIAGNOSIS — R69 Illness, unspecified: Secondary | ICD-10-CM

## 2017-02-23 DIAGNOSIS — J029 Acute pharyngitis, unspecified: Secondary | ICD-10-CM

## 2017-02-23 LAB — POCT RAPID STREP A (OFFICE): RAPID STREP A SCREEN: NEGATIVE

## 2017-02-23 MED ORDER — PREDNISONE 20 MG PO TABS: ORAL_TABLET | ORAL | 0 refills | Status: DC

## 2017-02-23 MED ORDER — OSELTAMIVIR PHOSPHATE 75 MG PO CAPS: 75.0000 mg | ORAL_CAPSULE | Freq: Two times a day (BID) | ORAL | 0 refills | Status: DC

## 2017-02-23 NOTE — ED Provider Notes (Signed)
[facility]    CSN: [id number] Arrival date & time: 02/23/17  0804     History   Chief Complaint Chief Complaint  Patient presents with  . Sore Throat    HPI Tammy Carrillo is a 62 y.o. female.   Complains of 1 day history flu-like illness including myalgias, headache, fever/chills, fatigue, and occasional cough.  Also has mild nasal congestion and sore throat.  Cough is non-productive and somewhat worse at night.  No pleuritic pain or shortness of breath.   She has a past history of asthma.   The history is provided by the patient.    Past Medical History:  Diagnosis Date  . Depression   . Migraines     There are no active problems to display for this patient.   Past Surgical History:  Procedure Laterality Date  . CHOLECYSTECTOMY      OB History    No data available       Home Medications    Prior to Admission medications   Medication Sig Start Date End Date Taking? Authorizing Provider  albuterol (PROVENTIL HFA;VENTOLIN HFA) 108 (90 Base) MCG/ACT inhaler Inhale 2 puffs into the lungs every 4 (four) hours as needed for wheezing or shortness of breath. 10/23/16   Kandra Nicolas, MD  diclofenac sodium (VOLTAREN) 1 % GEL Apply 2-4 g topically 4 (four) times daily. To affected joint/low back 09/03/16   Emeterio Reeve, DO  ipratropium (ATROVENT) 0.06 % nasal spray Place 2 sprays into both nostrils 4 (four) times daily. 11/28/16   Noe Gens, PA-C  Naproxen-Esomeprazole 500-20 MG TBEC Take 1 tablet by mouth 2 (two) times daily. 09/03/16   Emeterio Reeve, DO  oseltamivir (TAMIFLU) 75 MG capsule Take 1 capsule (75 mg total) by mouth every 12 (twelve) hours. 02/23/17   Kandra Nicolas, MD  predniSONE (DELTASONE) 20 MG tablet Take one tab by mouth twice daily for 4 days, then one daily for 3 days. Take with food. 02/23/17   Kandra Nicolas, MD  SUMAtriptan (IMITREX) 50 MG tablet Take 1 tablet (50 mg total) by mouth every 2 (two) hours as needed for  migraine. Repeat in 2 hours if headache persists. Max use 2 days/week. 09/03/16   Emeterio Reeve, DO  venlafaxine XR (EFFEXOR-XR) 150 MG 24 hr capsule Take 150 mg by mouth daily.    [provider]    Family History Family History  Problem Relation Age of Onset  . Cancer Father   . Stroke Father   . Diabetes Father   . Cancer Sister        breast  . Asthma Mother   . Stroke Maternal Grandmother   . Heart attack Paternal Grandmother     Social History Social History   Tobacco Use  . Smoking status: Former Smoker    Packs/day: 0.50    Years: 5.00    Pack years: 2.50    Types: Cigarettes    Last attempt to quit: 01/26/1985    Years since quitting: 32.0  . Smokeless tobacco: Never Used  Substance Use Topics  . Alcohol use: No  . Drug use: No     Allergies   Patient has no known allergies.   Review of Systems Review of Systems + sore throat + occasional cough No pleuritic pain No wheezing + nasal congestion + post-nasal drainage No sinus pain/pressure No itchy/red eyes No earache No hemoptysis No SOB + fever, + chills No nausea No vomiting No abdominal  pain No diarrhea No urinary symptoms No skin rash + fatigue + myalgias + headache Used OTC meds without relief   Physical Exam Triage Vital Signs ED Triage Vitals  Enc Vitals Group     BP 02/23/17 0904 130/90     Pulse Rate 02/23/17 0904 92     Resp --      Temp 02/23/17 0904 98 F (36.7 C)     Temp Source 02/23/17 0904 Oral     SpO2 02/23/17 0904 96 %     Weight 02/23/17 0907 189 lb (85.7 kg)     Height 02/23/17 0907 5\' 7"  (1.702 m)     Head Circumference --      Peak Flow --      Pain Score 02/23/17 0908 4     Pain Loc --      Pain Edu? --      Excl. in [place]? --    No data found.  Updated Vital Signs BP 130/90 (BP Location: Right Arm)   Pulse 92   Temp 98 F (36.7 C) (Oral)   Ht 5\' 7"  (1.702 m)   Wt 189 lb (85.7 kg)   SpO2 96%   BMI 29.60 kg/m   Visual Acuity Right  Eye Distance:   Left Eye Distance:   Bilateral Distance:    Right Eye Near:   Left Eye Near:    Bilateral Near:     Physical Exam Nursing notes and Vital Signs reviewed. Appearance:  Patient appears stated age, and in no acute distress Eyes:  Pupils are equal, round, and reactive to light and accomodation.  Extraocular movement is intact.  Conjunctivae are not inflamed  Ears:  Canals normal.  Tympanic membranes normal.  Nose:  Congested turbinates.  No sinus tenderness.   Pharynx:  Erythematous uvula. Neck:  Supple.  Enlarged posterior/lateral nodes are palpated bilaterally, tender to palpation on the left.   Lungs:  Clear to auscultation.  Breath sounds are equal.  Moving air well. Heart:  Regular rate and rhythm without murmurs, rubs, or gallops.  Abdomen:  Nontender without masses or hepatosplenomegaly.  Bowel sounds are present.  No CVA or flank tenderness.  Extremities:  No edema.  Skin:  No rash present.    UC Treatments / Results  Labs (all labs ordered are listed, but only abnormal results are displayed) Labs Reviewed -  POCT Rapid strep test negative  EKG  EKG Interpretation None       Radiology No results found.  Procedures Procedures (including critical care time)  Medications Ordered in UC Medications - No data to display   Initial Impression / Assessment and Plan / UC Course  I have reviewed the triage vital signs and the nursing notes.  Pertinent labs & imaging results that were available during my care of the patient were reviewed by me and considered in my medical decision making (see chart for details).    Begin empiric Tamiflu, and prednisone burst/taper. Take plain guaifenesin (1200mg  extended release tabs such as Mucinex) twice daily, with plenty of water, for cough and congestion.  May add Pseudoephedrine (30mg , one or two every 4 to 6 hours) for sinus congestion.  Get adequate rest.   May use Afrin nasal spray (or generic oxymetazoline) each  morning for about 5 days and then discontinue.  Also recommend using saline nasal spray several times daily and saline nasal irrigation (AYR is a common brand).  Use Flonase nasal spray each morning after using Afrin nasal spray  and saline nasal irrigation. Try warm salt water gargles for sore throat.  Stop all antihistamines for now, and other non-prescription cough/cold preparations. Continue albuterol inhaler as needed. May take Delsym Cough Suppressant at bedtime for nighttime cough.  Followup with Family Doctor if not improved in about 6 days. If symptoms become significantly worse during the night or over the weekend, proceed to the local emergency room.     Final Clinical Impressions(s) / UC Diagnoses   Final diagnoses:  Influenza-like illness  Pharyngitis, unspecified etiology    ED Discharge Orders        Ordered    oseltamivir (TAMIFLU) 75 MG capsule  Every 12 hours     02/23/17 0917    predniSONE (DELTASONE) 20 MG tablet     02/23/17 0917           Kandra Nicolas, MD 03/01/17 [phone]

## 2017-02-23 NOTE — ED Triage Notes (Signed)
Sore throat, fever , body aches, congestion, headache since yesteday

## 2017-02-23 NOTE — Discharge Instructions (Signed)
Take plain guaifenesin (1200mg  extended release tabs such as Mucinex) twice daily, with plenty of water, for cough and congestion.  May add Pseudoephedrine (30mg , one or two every 4 to 6 hours) for sinus congestion.  Get adequate rest.   May use Afrin nasal spray (or generic oxymetazoline) each morning for about 5 days and then discontinue.  Also recommend using saline nasal spray several times daily and saline nasal irrigation (AYR is a common brand).  Use Flonase nasal spray each morning after using Afrin nasal spray and saline nasal irrigation. Try warm salt water gargles for sore throat.  Stop all antihistamines for now, and other non-prescription cough/cold preparations. Continue albuterol inhaler as needed. May take Delsym Cough Suppressant at bedtime for nighttime cough.  If symptoms become significantly worse during the night or over the weekend, proceed to the local emergency room.

## 2017-02-24 ENCOUNTER — Telehealth: Payer: Self-pay | Admitting: Emergency Medicine

## 2017-02-24 LAB — STREP A DNA PROBE: GROUP A STREP PROBE: NOT DETECTED

## 2017-02-26 ENCOUNTER — Telehealth: Payer: Self-pay

## 2017-02-26 NOTE — Telephone Encounter (Signed)
Begin Z-pak (call in Rx).  Continue Mucinex.

## 2017-02-27 ENCOUNTER — Telehealth: Payer: Self-pay | Admitting: Emergency Medicine

## 2017-02-27 NOTE — Telephone Encounter (Signed)
Husband wanting strep results=negative; did not get message yesterday to start zpack; will pick it up and start today. pk

## 2017-02-27 NOTE — Telephone Encounter (Signed)
Husband here: no rx at pharmacy; spoke with [facility] and will phone in [place] now.pk

## 2017-02-28 ENCOUNTER — Telehealth: Payer: Self-pay | Admitting: Emergency Medicine

## 2017-02-28 NOTE — Telephone Encounter (Signed)
Inquired about patient's status; encourage them to call with questions/concerns.  

## 2017-03-08 ENCOUNTER — Encounter: Payer: BC Managed Care – PPO | Admitting: Osteopathic Medicine

## 2017-03-08 DIAGNOSIS — Z0189 Encounter for other specified special examinations: Secondary | ICD-10-CM

## 2017-05-04 ENCOUNTER — Encounter: Payer: Self-pay | Admitting: Osteopathic Medicine

## 2017-05-04 ENCOUNTER — Ambulatory Visit (INDEPENDENT_AMBULATORY_CARE_PROVIDER_SITE_OTHER): Payer: BC Managed Care – PPO | Admitting: Osteopathic Medicine

## 2017-05-04 ENCOUNTER — Ambulatory Visit (INDEPENDENT_AMBULATORY_CARE_PROVIDER_SITE_OTHER): Payer: BC Managed Care – PPO

## 2017-05-04 VITALS — BP 133/73 | HR 80 | Temp 98.1°F | Wt 189.0 lb

## 2017-05-04 DIAGNOSIS — R5383 Other fatigue: Secondary | ICD-10-CM | POA: Diagnosis not present

## 2017-05-04 DIAGNOSIS — R05 Cough: Secondary | ICD-10-CM

## 2017-05-04 DIAGNOSIS — R7982 Elevated C-reactive protein (CRP): Secondary | ICD-10-CM

## 2017-05-04 DIAGNOSIS — R5382 Chronic fatigue, unspecified: Secondary | ICD-10-CM | POA: Diagnosis not present

## 2017-05-04 DIAGNOSIS — N939 Abnormal uterine and vaginal bleeding, unspecified: Secondary | ICD-10-CM

## 2017-05-04 DIAGNOSIS — Z1231 Encounter for screening mammogram for malignant neoplasm of breast: Secondary | ICD-10-CM | POA: Diagnosis not present

## 2017-05-04 DIAGNOSIS — R61 Generalized hyperhidrosis: Secondary | ICD-10-CM | POA: Diagnosis not present

## 2017-05-04 DIAGNOSIS — R059 Cough, unspecified: Secondary | ICD-10-CM

## 2017-05-04 DIAGNOSIS — Z114 Encounter for screening for human immunodeficiency virus [HIV]: Secondary | ICD-10-CM

## 2017-05-04 DIAGNOSIS — Z1159 Encounter for screening for other viral diseases: Secondary | ICD-10-CM

## 2017-05-04 DIAGNOSIS — Z1239 Encounter for other screening for malignant neoplasm of breast: Secondary | ICD-10-CM

## 2017-05-04 DIAGNOSIS — R5381 Other malaise: Secondary | ICD-10-CM | POA: Diagnosis not present

## 2017-05-04 NOTE — Progress Notes (Addendum)
HPI: Tammy Carrillo is a 62 y.o. female who  has a past medical history of Depression and Migraines.  she presents to [facility] today, 05/04/17,  for chief complaint of: Persistent fatigue   Ongoing fatigue for the better part of 3 months. Accompanied by occasional night sweats/chills. Sleeping a lot especially on weekends, we'll get out of bed until 2 in the afternoon.Totally lacking motivation. There is some stress at home but patient denies any concerns with anxiety/depression. See review of systems as below, no other major red flag symptoms.    Past medical, surgical, social and family history reviewed:  There are no active problems to display for this patient.   Past Surgical History:  Procedure Laterality Date  . CHOLECYSTECTOMY      Social History   Tobacco Use  . Smoking status: Former Smoker    Packs/day: 0.50    Years: 5.00    Pack years: 2.50    Types: Cigarettes    Last attempt to quit: 01/26/1985    Years since quitting: 32.2  . Smokeless tobacco: Never Used  Substance Use Topics  . Alcohol use: No    Family History  Problem Relation Age of Onset  . Cancer Father   . Stroke Father   . Diabetes Father   . Cancer Sister        breast  . Asthma Mother   . Stroke Maternal Grandmother   . Heart attack Paternal Grandmother      Current medication list and allergy/intolerance information reviewed:    Current Outpatient Medications  Medication Sig Dispense Refill  . albuterol (PROVENTIL HFA;VENTOLIN HFA) 108 (90 Base) MCG/ACT inhaler Inhale 2 puffs into the lungs every 4 (four) hours as needed for wheezing or shortness of breath. 1 Inhaler 1  . diclofenac sodium (VOLTAREN) 1 % GEL Apply 2-4 g topically 4 (four) times daily. To affected joint/low back 100 g 11  . predniSONE (DELTASONE) 20 MG tablet Take one tab by mouth twice daily for 4 days, then one daily for 3 days. Take with food. 11 tablet 0  . venlafaxine XR  (EFFEXOR-XR) 150 MG 24 hr capsule Take 150 mg by mouth daily.    Marland Kitchen ipratropium (ATROVENT) 0.06 % nasal spray Place 2 sprays into both nostrils 4 (four) times daily. (Patient not taking: Reported on 05/04/2017) 15 mL 1  . Naproxen-Esomeprazole 500-20 MG TBEC Take 1 tablet by mouth 2 (two) times daily. (Patient not taking: Reported on 05/04/2017) 60 tablet 3  . oseltamivir (TAMIFLU) 75 MG capsule Take 1 capsule (75 mg total) by mouth every 12 (twelve) hours. (Patient not taking: Reported on 05/04/2017) 10 capsule 0  . SUMAtriptan (IMITREX) 50 MG tablet Take 1 tablet (50 mg total) by mouth every 2 (two) hours as needed for migraine. Repeat in 2 hours if headache persists. Max use 2 days/week. (Patient not taking: Reported on 05/04/2017) 30 tablet 0   No current facility-administered medications for this visit.     No Known Allergies    Review of Systems:  Constitutional:  No  fever, +chills, No recent illness, No unintentional weight changes. +significant fatigue.   HEENT: No  headache, no vision change, no hearing change, No sore throat, No  sinus pressure  Cardiac: No  chest pain, No  pressure, No palpitations  Respiratory:  No  shortness of breath. No  Cough  Gastrointestinal: No  abdominal pain, No  nausea, No  vomiting,  No  blood in stool,  No  diarrhea, No  constipation   Musculoskeletal: No new myalgia/arthralgia  Skin: No  Rash, No other wounds/concerning lesions  Genitourinary: No  incontinence, No  abnormal genital bleeding, No abnormal genital discharge  Hem/Onc: No  easy bruising/bleeding, No  abnormal lymph node  Endocrine: No cold intolerance,  No heat intolerance. No polyuria/polydipsia/polyphagia   Neurologic: +generalized nonfocal weakness, No  dizziness  Psychiatric: No  concerns with depression, No  concerns with anxiety, No sleep problems, No mood problems  Exam:  BP 133/73 (BP Location: Left Arm, Patient Position: Sitting, Cuff Size: Large)   Pulse 80   Temp 98.1 F  (36.7 C) (Oral)   Wt 189 lb 0.6 oz (85.7 kg)   BMI 29.61 kg/m   Constitutional: VS see above. General Appearance: alert, well-developed, well-nourished, NAD  Eyes: Normal lids and conjunctive, non-icteric sclera  Ears, Nose, Mouth, Throat: MMM, Normal external inspection ears/nares/mouth/lips/gums. TM normal bilaterally. Pharynx/tonsils no erythema, no exudate. Nasal mucosa normal.   Neck: No masses, trachea midline. No thyroid enlargement. No tenderness/mass appreciated. No lymphadenopathy  Respiratory: Normal respiratory effort. no wheeze, no rhonchi, no rales  Cardiovascular: S1/S2 normal, no murmur, no rub/gallop auscultated. RRR. No lower extremity edema  Gastrointestinal: Nontender, no masses. No hepatomegaly, no splenomegaly. No hernia appreciated. Bowel sounds normal. Rectal exam deferred.   Musculoskeletal: Gait normal. No clubbing/cyanosis of digits.   Neurological: Normal balance/coordination. No tremor. No cranial nerve deficit on limited exam. Motor and sensation intact and symmetric. Cerebellar reflexes intact.   Skin: warm, dry, intact. No rash/ulcer. No concerning nevi or subq nodules on limited exam.    Psychiatric: Normal judgment/insight. Normal mood and affect. Oriented x3.       ASSESSMENT/PLAN:   Chronic fatigue and malaise - Plan: CBC with Differential/Platelet, COMPLETE METABOLIC PANEL WITH GFR, TSH, VITAMIN D 25 Hydroxy (Vit-D Deficiency, Fractures), Urinalysis, Routine w reflex microscopic, HIV antibody, Hepatitis C antibody, High sensitivity CRP, Sedimentation rate  Breast cancer screening - Plan: MM 3D SCREEN BREAST BILATERAL  Cough - Plan: DG Chest 2 View  Night sweat  Abnormal uterine bleeding  Screening for HIV (human immunodeficiency virus) - Plan: HIV antibody  Need for hepatitis C screening test - Plan: Hepatitis C antibody      Visit summary with medication list and pertinent instructions was printed for patient to review. All  questions at time of visit were answered - patient instructed to contact office with any additional concerns. ER/RTC precautions were reviewed with the patient.   Follow-up plan: Return for recheck based on results / symptoms .     ADDENDUM 05/06/17 12:26 PM   Results for orders placed or performed in visit on 05/04/17 (from the past 72 hour(s))  CBC with Differential/Platelet     Status: Abnormal   Collection Time: 05/04/17  4:07 PM  Result Value Ref Range   WBC 3.7 (L) 3.8 - 10.8 Thousand/uL   RBC 4.76 3.80 - 5.10 Million/uL   Hemoglobin 14.4 11.7 - 15.5 g/dL   HCT 41.8 35.0 - 45.0 %   MCV 87.8 80.0 - 100.0 fL   MCH 30.3 27.0 - 33.0 pg   MCHC 34.4 32.0 - 36.0 g/dL   RDW 12.7 11.0 - 15.0 %   Platelets 262 140 - 400 Thousand/uL   MPV 11.5 7.5 - 12.5 fL   Neutro Abs 1,432 (L) 1,500 - 7,800 cells/uL   Lymphs Abs 1,647 850 - 3,900 cells/uL   WBC mixed population 455 200 - 950 cells/uL   Eosinophils  Absolute 148 15 - 500 cells/uL   Basophils Absolute 19 0 - 200 cells/uL   Neutrophils Relative % 38.7 %   Total Lymphocyte 44.5 %   Monocytes Relative 12.3 %   Eosinophils Relative 4.0 %   Basophils Relative 0.5 %  COMPLETE METABOLIC PANEL WITH GFR     Status: None   Collection Time: 05/04/17  4:07 PM  Result Value Ref Range   Glucose, Bld 98 65 - 99 mg/dL    Comment: .            Fasting reference interval .    BUN 16 7 - 25 mg/dL   Creat 0.81 0.50 - 0.99 mg/dL    Comment: For patients >32 years of age, the reference limit for Creatinine is approximately 13% higher for people identified as African-American. .    GFR, Est Non African American 78 > OR = 60 mL/min/1.48m2   GFR, Est African American 91 > OR = 60 mL/min/1.58m2   BUN/Creatinine Ratio NOT APPLICABLE 6 - 22 (calc)   Sodium 141 135 - 146 mmol/L   Potassium 3.8 3.5 - 5.3 mmol/L   Chloride 103 98 - 110 mmol/L   CO2 28 20 - 32 mmol/L   Calcium 9.2 8.6 - 10.4 mg/dL   Total Protein 6.7 6.1 - 8.1 g/dL   Albumin 4.1  3.6 - 5.1 g/dL   Globulin 2.6 1.9 - 3.7 g/dL (calc)   AG Ratio 1.6 1.0 - 2.5 (calc)   Total Bilirubin 0.3 0.2 - 1.2 mg/dL   Alkaline phosphatase (APISO) 69 33 - 130 U/L   AST 22 10 - 35 U/L   ALT 17 6 - 29 U/L  TSH     Status: None   Collection Time: 05/04/17  4:07 PM  Result Value Ref Range   TSH 0.48 0.40 - 4.50 mIU/L  VITAMIN D 25 Hydroxy (Vit-D Deficiency, Fractures)     Status: None   Collection Time: 05/04/17  4:07 PM  Result Value Ref Range   Vit D, 25-Hydroxy 37 30 - 100 ng/mL    Comment: Vitamin D Status         25-OH Vitamin D: . Deficiency:                    <20 ng/mL Insufficiency:             20 - 29 ng/mL Optimal:                 > or = 30 ng/mL . For 25-OH Vitamin D testing on patients on  D2-supplementation and patients for whom quantitation  of D2 and D3 fractions is required, the QuestAssureD(TM) 25-OH VIT D, (D2,D3), LC/MS/MS is recommended: order  code [id number] (patients >8yrs). . For more information on this test, go to: http://education.questdiagnostics.com/faq/FAQ163 (This link is being provided for  informational/educational purposes only.)   Urinalysis, Routine w reflex microscopic     Status: Abnormal   Collection Time: 05/04/17  4:07 PM  Result Value Ref Range   Color, Urine DARK YELLOW YELLOW   APPearance CLOUDY (A) CLEAR   Specific Gravity, Urine 1.034 1.001 - 1.03   pH 5.5 5.0 - 8.0   Glucose, UA NEGATIVE NEGATIVE   Bilirubin Urine NEGATIVE NEGATIVE   Ketones, ur TRACE (A) NEGATIVE   Hgb urine dipstick NEGATIVE NEGATIVE   Protein, ur TRACE (A) NEGATIVE   Nitrite NEGATIVE NEGATIVE   Leukocytes, UA NEGATIVE NEGATIVE   WBC, UA 0-5 0 -  5 /HPF   RBC / HPF NONE SEEN 0 - 2 /HPF   Squamous Epithelial / LPF 0-5 < OR = 5 /HPF   Bacteria, UA NONE SEEN NONE SEEN /HPF   Calcium Oxalate Crystal FEW NONE OR FE /HPF   Hyaline Cast NONE SEEN NONE SEEN /LPF  HIV antibody     Status: None   Collection Time: 05/04/17  4:07 PM  Result Value Ref Range    HIV 1&2 Ab, 4th Generation NON-REACTIVE NON-REACTI    Comment: HIV-1 antigen and HIV-1/HIV-2 antibodies were not detected. There is no laboratory evidence of HIV infection. Marland Kitchen PLEASE NOTE: This information has been disclosed to you from records whose confidentiality may be protected by state law.  If your state requires such protection, then the state law prohibits you from making any further disclosure of the information without the specific written consent of the person to whom it pertains, or as otherwise permitted by law. A general authorization for the release of medical or other information is NOT sufficient for this purpose. . For additional information please refer to http://education.quest[facility].com/faq/FAQ106 (This link is being provided for informational/ educational purposes only.) . Marland Kitchen The performance of this assay has not been clinically validated in patients less than 23 years old. .   Hepatitis C antibody     Status: None   Collection Time: 05/04/17  4:07 PM  Result Value Ref Range   Hepatitis C Ab NON-REACTIVE NON-REACTI   SIGNAL TO CUT-OFF 0.01 <1.00    Comment: . HCV antibody was non-reactive. There is no laboratory  evidence of HCV infection. . In most cases, no further action is required. However, if recent HCV exposure is suspected, a test for HCV RNA (test code [phone]) is suggested. . For additional information please refer to http://education.[facility].com/faq/FAQ22v1 (This link is being provided for informational/ educational purposes only.) .   High sensitivity CRP     Status: Abnormal   Collection Time: 05/04/17  4:07 PM  Result Value Ref Range   hs-CRP >10.0 (H) mg/L    Comment: Reference Range Optimal <1.0 Jellinger PS et al. [facility] [place] Pract.2017;23(Suppl 2):1-87. . For ages >35 Years: hs-CRP mg/L  Risk According to AHA/CDC Guidelines <1.0         Lower relative cardiovascular risk. 1.0-3.0      Average relative cardiovascular  risk. 3.1-10.0     Higher relative cardiovascular risk.              Consider retesting in 1 to 2 weeks to              exclude a benign transient elevation              in the baseline CRP value secondary              to infection or inflammation. >10.0        Persistent elevation, upon retesting,              may be associated with infection and              inflammation. .   Sedimentation rate     Status: None   Collection Time: 05/04/17  4:07 PM  Result Value Ref Range   Sed Rate 9 0 - 30 mm/h     Orders Placed This Encounter  Procedures  . MICROSCOPIC MESSAGE  . MM 3D SCREEN BREAST BILATERAL  . DG Chest 2 View  . CBC with Differential/Platelet  . COMPLETE METABOLIC  PANEL WITH GFR  . TSH  . VITAMIN D 25 Hydroxy (Vit-D Deficiency, Fractures)  . Urinalysis, Routine w reflex microscopic  . HIV antibody  . Hepatitis C antibody  . High sensitivity CRP  . Sedimentation rate  . CK  . ANA  . Rheumatoid factor     Please note: voice recognition software was used to produce this document, and typos may escape review. Please contact Dr. Sheppard Coil for any needed clarifications.

## 2017-05-05 ENCOUNTER — Encounter: Payer: Self-pay | Admitting: Osteopathic Medicine

## 2017-05-05 LAB — CBC WITH DIFFERENTIAL/PLATELET
BASOS ABS: 19 {cells}/uL (ref 0–200)
BASOS PCT: 0.5 %
EOS ABS: 148 {cells}/uL (ref 15–500)
Eosinophils Relative: 4 %
HCT: 41.8 % (ref 35.0–45.0)
Hemoglobin: 14.4 g/dL (ref 11.7–15.5)
Lymphs Abs: 1647 cells/uL (ref 850–3900)
MCH: 30.3 pg (ref 27.0–33.0)
MCHC: 34.4 g/dL (ref 32.0–36.0)
MCV: 87.8 fL (ref 80.0–100.0)
MONOS PCT: 12.3 %
MPV: 11.5 fL (ref 7.5–12.5)
NEUTROS PCT: 38.7 %
Neutro Abs: 1432 cells/uL — ABNORMAL LOW (ref 1500–7800)
PLATELETS: 262 10*3/uL (ref 140–400)
RBC: 4.76 10*6/uL (ref 3.80–5.10)
RDW: 12.7 % (ref 11.0–15.0)
TOTAL LYMPHOCYTE: 44.5 %
WBC mixed population: 455 cells/uL (ref 200–950)
WBC: 3.7 10*3/uL — ABNORMAL LOW (ref 3.8–10.8)

## 2017-05-05 LAB — URINALYSIS, ROUTINE W REFLEX MICROSCOPIC
Bacteria, UA: NONE SEEN /HPF
Bilirubin Urine: NEGATIVE
GLUCOSE, UA: NEGATIVE
HGB URINE DIPSTICK: NEGATIVE
HYALINE CAST: NONE SEEN /LPF
Leukocytes, UA: NEGATIVE
Nitrite: NEGATIVE
PH: 5.5 (ref 5.0–8.0)
RBC / HPF: NONE SEEN /HPF (ref 0–2)
Specific Gravity, Urine: 1.034 (ref 1.001–1.03)

## 2017-05-05 LAB — HIV ANTIBODY (ROUTINE TESTING W REFLEX): HIV 1&2 Ab, 4th Generation: NONREACTIVE

## 2017-05-05 LAB — COMPLETE METABOLIC PANEL WITH GFR
AG RATIO: 1.6 (calc) (ref 1.0–2.5)
ALBUMIN MSPROF: 4.1 g/dL (ref 3.6–5.1)
ALT: 17 U/L (ref 6–29)
AST: 22 U/L (ref 10–35)
Alkaline phosphatase (APISO): 69 U/L (ref 33–130)
BILIRUBIN TOTAL: 0.3 mg/dL (ref 0.2–1.2)
BUN: 16 mg/dL (ref 7–25)
CALCIUM: 9.2 mg/dL (ref 8.6–10.4)
CHLORIDE: 103 mmol/L (ref 98–110)
CO2: 28 mmol/L (ref 20–32)
Creat: 0.81 mg/dL (ref 0.50–0.99)
GFR, EST AFRICAN AMERICAN: 91 mL/min/{1.73_m2} (ref >=60)
GFR, EST NON AFRICAN AMERICAN: 78 mL/min/{1.73_m2} (ref >=60)
GLOBULIN: 2.6 g/dL (ref 1.9–3.7)
Glucose, Bld: 98 mg/dL (ref 65–99)
POTASSIUM: 3.8 mmol/L (ref 3.5–5.3)
Sodium: 141 mmol/L (ref 135–146)
Total Protein: 6.7 g/dL (ref 6.1–8.1)

## 2017-05-05 LAB — VITAMIN D 25 HYDROXY (VIT D DEFICIENCY, FRACTURES): Vit D, 25-Hydroxy: 37 ng/mL (ref 30–100)

## 2017-05-05 LAB — HIGH SENSITIVITY CRP: hs-CRP: >10 mg/L — ABNORMAL HIGH

## 2017-05-05 LAB — HEPATITIS C ANTIBODY
Hepatitis C Ab: NONREACTIVE
SIGNAL TO CUT-OFF: 0.01 (ref <1.00)

## 2017-05-05 LAB — SEDIMENTATION RATE: Sed Rate: 9 mm/h (ref 0–30)

## 2017-05-05 LAB — TSH: TSH: 0.48 mIU/L (ref 0.40–4.50)

## 2017-05-06 NOTE — Addendum Note (Signed)
Addended by: Maryla Morrow on: 05/06/2017 12:26 PM   Modules accepted: Orders

## 2017-05-20 ENCOUNTER — Ambulatory Visit: Payer: BC Managed Care – PPO

## 2017-10-12 DIAGNOSIS — Q513 Bicornate uterus: Secondary | ICD-10-CM | POA: Insufficient documentation

## 2018-01-09 ENCOUNTER — Encounter: Payer: Self-pay | Admitting: Emergency Medicine

## 2018-01-09 ENCOUNTER — Emergency Department (INDEPENDENT_AMBULATORY_CARE_PROVIDER_SITE_OTHER)
Admission: EM | Admit: 2018-01-09 | Discharge: 2018-01-09 | Disposition: A | Discharge status: Home / Self Care | Payer: BC Managed Care – PPO | Source: Home / Self Care

## 2018-01-09 DIAGNOSIS — J029 Acute pharyngitis, unspecified: Secondary | ICD-10-CM | POA: Diagnosis not present

## 2018-01-09 LAB — POCT RAPID STREP A (OFFICE): RAPID STREP A SCREEN: NEGATIVE

## 2018-01-09 NOTE — ED Triage Notes (Signed)
Patient c/o sore throat x 1 day, body chills, nausea, exposed to strep.

## 2018-01-09 NOTE — Discharge Instructions (Addendum)
Warm salt water gargles,  Tylenol for fever or bodyaches,

## 2018-01-09 NOTE — ED Provider Notes (Signed)
[facility]    CSN: [id number] Arrival date & time: 01/09/18  1256     History   Chief Complaint Chief Complaint  Patient presents with  . Sore Throat    HPI Tammy Carrillo is a 62 y.o. female.   The history is provided by the patient. No language interpreter was used.  Sore Throat  This is a new problem. The current episode started yesterday. The problem occurs constantly. The problem has been gradually worsening. Pertinent negatives include no headaches. Nothing aggravates the symptoms. Nothing relieves the symptoms. She has tried nothing for the symptoms. The treatment provided no relief.   Pt worried that she has strep.  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 Depression   . Migraines     Patient Active Problem List   Diagnosis Date Noted  . Migraine headache 09/02/2011  . Depression 03/13/2011    Past Surgical History:  Procedure Laterality Date  . CHOLECYSTECTOMY      OB History   No obstetric history on file.      Home Medications    Prior to Admission medications   Medication Sig Start Date End Date Taking? Authorizing Provider  albuterol (PROVENTIL HFA;VENTOLIN HFA) 108 (90 Base) MCG/ACT inhaler Inhale 2 puffs into the lungs every 4 (four) hours as needed for wheezing or shortness of breath. 10/23/16   Kandra Nicolas, MD  diclofenac sodium (VOLTAREN) 1 % GEL Apply 2-4 g topically 4 (four) times daily. To affected joint/low back 09/03/16   Emeterio Reeve, DO  venlafaxine XR (EFFEXOR-XR) 150 MG 24 hr capsule Take 150 mg by mouth daily.    [provider]    Family History Family History  Problem Relation Age of Onset  . Cancer Father   . Stroke Father   . Diabetes Father   . Cancer Sister        breast  . Asthma Mother   . Stroke Maternal Grandmother   . Heart attack Paternal Grandmother     Social History Social History   Tobacco Use  . Smoking status: Former Smoker    Packs/day:  0.50    Years: 5.00    Pack years: 2.50    Types: Cigarettes    Last attempt to quit: 01/26/1985    Years since quitting: 32.9  . Smokeless tobacco: Never Used  Substance Use Topics  . Alcohol use: No  . Drug use: No     Allergies   Patient has no known allergies.   Review of Systems Review of Systems  Neurological: Negative for headaches.  All other systems reviewed and are negative.    Physical Exam Triage Vital Signs ED Triage Vitals  Enc Vitals Group     BP 01/09/18 1324 125/84     Pulse Rate 01/09/18 1324 75     Resp --      Temp 01/09/18 1324 98 F (36.7 C)     Temp Source 01/09/18 1324 Oral     SpO2 01/09/18 1324 97 %     Weight 01/09/18 1325 187 lb 4 oz (84.9 kg)     Height 01/09/18 1325 5\' 7"  (1.702 m)     Head Circumference --      Peak Flow --      Pain Score 01/09/18 1325 0     Pain Loc --      Pain Edu? --      Excl. in [place]? --  No data found.  Updated Vital Signs BP 125/84 (BP Location: Right Arm)   Pulse 75   Temp 98 F (36.7 C) (Oral)   Ht 5\' 7"  (1.702 m)   Wt 187 lb 4 oz (84.9 kg)   SpO2 97%   BMI 29.33 kg/m   Visual Acuity Right Eye Distance:   Left Eye Distance:   Bilateral Distance:    Right Eye Near:   Left Eye Near:    Bilateral Near:     Physical Exam Vitals signs and nursing note reviewed.  HENT:     Head: Normocephalic.     Right Ear: Tympanic membrane normal.     Left Ear: Tympanic membrane normal.     Mouth/Throat:     Mouth: Mucous membranes are moist.     Pharynx: Posterior oropharyngeal erythema present.  Eyes:     Conjunctiva/sclera: Conjunctivae normal.  Neck:     Musculoskeletal: Normal range of motion.  Cardiovascular:     Rate and Rhythm: Normal rate.     Heart sounds: Normal heart sounds.  Skin:    General: Skin is warm.     Capillary Refill: Capillary refill takes less than 2 seconds.  Neurological:     Mental Status: She is alert.      UC Treatments / Results  Labs (all labs ordered are  listed, but only abnormal results are displayed) Labs Reviewed  STREP A DNA PROBE  POCT RAPID STREP A (OFFICE)    EKG None  Radiology No results found.  Procedures Procedures (including critical care time)  Medications Ordered in UC Medications - No data to display  Initial Impression / Assessment and Plan / UC Course  I have reviewed the triage vital signs and the nursing notes.  Pertinent labs & imaging results that were available during my care of the patient were reviewed by me and considered in my medical decision making (see chart for details).     MDM  Strep negative, probable viral illness  Final Clinical Impressions(s) / UC Diagnoses   Final diagnoses:  Sore throat  Pharyngitis, unspecified etiology     Discharge Instructions     Warm salt water gargles,  Tylenol for fever or bodyaches,   ED Prescriptions    None     Controlled Substance Prescriptions [place] Controlled Substance Registry consulted? Not Applicable   Fransico Meadow, [place] 01/09/18 1418

## 2018-01-10 ENCOUNTER — Telehealth: Payer: Self-pay | Admitting: *Deleted

## 2018-01-10 LAB — STREP A DNA PROBE: Group A Strep Probe: NOT DETECTED

## 2018-01-10 NOTE — Telephone Encounter (Signed)
LM with Tcx results and to call back if she has any questions or concerns.

## 2018-01-16 ENCOUNTER — Emergency Department (INDEPENDENT_AMBULATORY_CARE_PROVIDER_SITE_OTHER)
Admission: EM | Admit: 2018-01-16 | Discharge: 2018-01-16 | Disposition: A | Discharge status: Home / Self Care | Payer: BC Managed Care – PPO | Source: Home / Self Care | Attending: Family Medicine | Admitting: Family Medicine

## 2018-01-16 ENCOUNTER — Other Ambulatory Visit: Payer: Self-pay

## 2018-01-16 DIAGNOSIS — J0100 Acute maxillary sinusitis, unspecified: Secondary | ICD-10-CM

## 2018-01-16 DIAGNOSIS — J069 Acute upper respiratory infection, unspecified: Secondary | ICD-10-CM

## 2018-01-16 MED ORDER — AMOXICILLIN-POT CLAVULANATE 875-125 MG PO TABS: 1.0000 | ORAL_TABLET | Freq: Two times a day (BID) | ORAL | 0 refills | Status: DC

## 2018-01-16 MED ORDER — BENZONATATE 100 MG PO CAPS: 100.0000 mg | ORAL_CAPSULE | Freq: Three times a day (TID) | ORAL | 0 refills | Status: DC

## 2018-01-16 NOTE — ED Triage Notes (Signed)
Pt here today for persistent cough with yellow/dark green mucous. Says "bronchial tubes feel like theyre on fire". Was seen here last weekend in UC for similar thing. Fever at home of 99-100. Took motrin at noon today.

## 2018-01-16 NOTE — Discharge Instructions (Signed)
  You may take 500mg acetaminophen every 4-6 hours or in combination with ibuprofen 400-600mg every 6-8 hours as needed for pain, inflammation, and fever.  Be sure to well hydrated with clear liquids and get at least 8 hours of sleep at night, preferably more while sick.   Please follow up with family medicine in 1 week if needed.   

## 2018-01-16 NOTE — ED Provider Notes (Signed)
[facility]    CSN: [id number] Arrival date & time: 01/16/18  1253     History   Chief Complaint Chief Complaint  Patient presents with  . Cough    HPI Tammy Carrillo is a 62 y.o. female.   HPI  Tammy Carrillo is a 62 y.o. female presenting to UC with c/o worsening productive cough with yellow/dark green mucous for 1 week.  "It feels like my bronchial tubes are on fire."  She was seen at UC last weekend, tested negative for strep.  Temp at home between 99-100*F.  She took Motrin at noon today. Denies n/v/d.    Past Medical History:  Diagnosis Date  . Depression   . Migraines     Patient Active Problem List   Diagnosis Date Noted  . Migraine headache 09/02/2011  . Depression 03/13/2011    Past Surgical History:  Procedure Laterality Date  . CHOLECYSTECTOMY      OB History   No obstetric history on file.      Home Medications    Prior to Admission medications   Medication Sig Start Date End Date Taking? Authorizing Provider  albuterol (PROVENTIL HFA;VENTOLIN HFA) 108 (90 Base) MCG/ACT inhaler Inhale 2 puffs into the lungs every 4 (four) hours as needed for wheezing or shortness of breath. 10/23/16   Kandra Nicolas, MD  amoxicillin-clavulanate (AUGMENTIN) 875-125 MG tablet Take 1 tablet by mouth 2 (two) times daily. One po bid x 7 days 01/16/18   Noe Gens, PA-C  benzonatate (TESSALON) 100 MG capsule Take 1-2 capsules (100-200 mg total) by mouth every 8 (eight) hours. 01/16/18   Noe Gens, PA-C  diclofenac sodium (VOLTAREN) 1 % GEL Apply 2-4 g topically 4 (four) times daily. To affected joint/low back 09/03/16   Emeterio Reeve, DO  venlafaxine XR (EFFEXOR-XR) 150 MG 24 hr capsule Take 150 mg by mouth daily.    [provider]    Family History Family History  Problem Relation Age of Onset  . Cancer Father   . Stroke Father   . Diabetes Father   . Cancer Sister        breast  . Asthma Mother   . Stroke Maternal  Grandmother   . Heart attack Paternal Grandmother     Social History Social History   Tobacco Use  . Smoking status: Former Smoker    Packs/day: 0.50    Years: 5.00    Pack years: 2.50    Types: Cigarettes    Last attempt to quit: 01/26/1985    Years since quitting: 32.9  . Smokeless tobacco: Never Used  Substance Use Topics  . Alcohol use: No  . Drug use: No     Allergies   Patient has no known allergies.   Review of Systems Review of Systems  Constitutional: Positive for fever. Negative for chills.  HENT: Positive for congestion, ear pain (Right) and sore throat. Negative for trouble swallowing and voice change.   Respiratory: Positive for cough and chest tightness. Negative for shortness of breath.   Cardiovascular: Negative for chest pain and palpitations.  Gastrointestinal: Negative for abdominal pain, diarrhea, nausea and vomiting.  Musculoskeletal: Negative for arthralgias, back pain and myalgias.  Skin: Negative for rash.     Physical Exam Triage Vital Signs ED Triage Vitals  Enc Vitals Group     BP 01/16/18 1343 138/82     Pulse Rate 01/16/18 1343 90     Resp --  Temp 01/16/18 1343 97.6 F (36.4 C)     Temp Source 01/16/18 1343 Oral     SpO2 01/16/18 1343 96 %     Weight 01/16/18 1344 186 lb (84.4 kg)     Height 01/16/18 1344 5\' 7"  (1.702 m)     Head Circumference --      Peak Flow --      Pain Score 01/16/18 1344 0     Pain Loc --      Pain Edu? --      Excl. in [place]? --    No data found.  Updated Vital Signs BP 138/82 (BP Location: Right Arm)   Pulse 90   Temp 97.6 F (36.4 C) (Oral)   Ht 5\' 7"  (1.702 m)   Wt 186 lb (84.4 kg)   SpO2 96%   BMI 29.13 kg/m   Visual Acuity Right Eye Distance:   Left Eye Distance:   Bilateral Distance:    Right Eye Near:   Left Eye Near:    Bilateral Near:     Physical Exam Vitals signs and nursing note reviewed.  Constitutional:      Appearance: Normal appearance. She is well-developed.  HENT:      Head: Normocephalic and atraumatic.     Right Ear: Tympanic membrane normal.     Left Ear: Tympanic membrane normal.     Nose:     Right Sinus: Maxillary sinus tenderness present. No frontal sinus tenderness.     Left Sinus: Maxillary sinus tenderness present. No frontal sinus tenderness.     Mouth/Throat:     Lips: Pink.     Mouth: Mucous membranes are moist.     Pharynx: Oropharynx is clear. Uvula midline. No pharyngeal swelling, oropharyngeal exudate, posterior oropharyngeal erythema or uvula swelling.  Neck:     Musculoskeletal: Normal range of motion and neck supple.  Cardiovascular:     Rate and Rhythm: Normal rate and regular rhythm.  Pulmonary:     Effort: Pulmonary effort is normal. No respiratory distress.     Breath sounds: Normal breath sounds. No stridor. No wheezing, rhonchi or rales.  Musculoskeletal: Normal range of motion.  Skin:    General: Skin is warm and dry.  Neurological:     Mental Status: She is alert and oriented to person, place, and time.  Psychiatric:        Behavior: Behavior normal.      UC Treatments / Results  Labs (all labs ordered are listed, but only abnormal results are displayed) Labs Reviewed - No data to display  EKG None  Radiology No results found.  Procedures Procedures (including critical care time)  Medications Ordered in UC Medications - No data to display  Initial Impression / Assessment and Plan / UC Course  I have reviewed the triage vital signs and the nursing notes.  Pertinent labs & imaging results that were available during my care of the patient were reviewed by me and considered in my medical decision making (see chart for details).     Hx and exam c/w maxillary sinusitis secondary to URI Home care info provided  Final Clinical Impressions(s) / UC Diagnoses   Final diagnoses:  Upper respiratory tract infection, unspecified type  Acute non-recurrent maxillary sinusitis     Discharge Instructions       You may take 500mg  acetaminophen every 4-6 hours or in combination with ibuprofen 400-600mg  every 6-8 hours as needed for pain, inflammation, and fever.  Be sure to well  hydrated with clear liquids and get at least 8 hours of sleep at night, preferably more while sick.   Please follow up with family medicine in 1 week if needed.     ED Prescriptions    Medication Sig Dispense Auth. Provider   amoxicillin-clavulanate (AUGMENTIN) 875-125 MG tablet Take 1 tablet by mouth 2 (two) times daily. One po bid x 7 days 14 tablet Rashan Patient O, PA-C   benzonatate (TESSALON) 100 MG capsule Take 1-2 capsules (100-200 mg total) by mouth every 8 (eight) hours. 21 capsule Noe Gens, PA-C     Controlled Substance Prescriptions [place] Controlled Substance Registry consulted? Not Applicable   Tyrell Antonio 01/16/18 1441

## 2018-01-27 ENCOUNTER — Encounter: Payer: Self-pay | Admitting: Osteopathic Medicine

## 2018-01-27 ENCOUNTER — Ambulatory Visit (INDEPENDENT_AMBULATORY_CARE_PROVIDER_SITE_OTHER): Payer: BC Managed Care – PPO | Admitting: Osteopathic Medicine

## 2018-01-27 VITALS — BP 127/62 | HR 62 | Temp 97.5°F | Wt 188.9 lb

## 2018-01-27 DIAGNOSIS — J069 Acute upper respiratory infection, unspecified: Secondary | ICD-10-CM

## 2018-01-27 DIAGNOSIS — J019 Acute sinusitis, unspecified: Secondary | ICD-10-CM

## 2018-01-27 MED ORDER — IPRATROPIUM BROMIDE 0.06 % NA SOLN: 2.0000 | Freq: Four times a day (QID) | NASAL | 1 refills | Status: DC

## 2018-01-27 MED ORDER — DOXYCYCLINE HYCLATE 100 MG PO TABS: 100.0000 mg | ORAL_TABLET | Freq: Two times a day (BID) | ORAL | 0 refills | Status: DC

## 2018-01-27 MED ORDER — PREDNISONE 20 MG PO TABS: 20.0000 mg | ORAL_TABLET | Freq: Two times a day (BID) | ORAL | 0 refills | Status: DC

## 2018-01-27 NOTE — Patient Instructions (Signed)
Medications & Home Remedies for Upper Respiratory Illness   Note: the following list assumes no pregnancy, normal liver & kidney function and no other drug interactions. Dr. Sheppard Coil has highlighted medications which are safe for you to use, but these may not be appropriate for everyone. Always ask a pharmacist or qualified medical provider if you have any questions!    Aches/Pains, Fever, Headache OTC Acetaminophen (Tylenol) 500 mg tablets - take max 2 tablets (1000 mg) every 6 hours (4 times per day)  OTC Ibuprofen (Motrin) 200 mg tablets - take max 4 tablets (800 mg) every 6 hours*   Sinus Congestion Prescription Atrovent as directed OTC Nasal Saline if desired to rinse OTC Oxymetolazone (Afrin, others) sparing use due to rebound congestion, NEVER use in kids OTC Phenylephrine (Sudafed) 10 mg tablets every 4 hours (or the 12-hour formulation)* OTC Diphenhydramine (Benadryl) 25 mg tablets - take max 2 tablets every 4 hours   Cough & Sore Throat OTC Dextromethorphan (Robitussin, others) - cough suppressant OTC Guaifenesin (Robitussin, Mucinex, others) - expectorant (helps cough up mucus) (Dextromethorphan and Guaifenesin also come in a combination tablet/syrup) OTC Lozenges w/ Benzocaine + Menthol (Cepacol) Honey - as much as you want! Teas which "coat the throat" - look for ingredients Elm Bark, Licorice Root, Marshmallow Root   Other Prescription Oral Steroids to decrease inflammation and improve energy Prescription Antibiotics if these are necessary for bacterial infection - take ALL, even if you're feeling better  OTC Zinc Lozenges within 24 hours of symptoms onset - mixed evidence this shortens the duration of the common cold Don't waste your money on Vitamin C or Echinacea in acute illness - it's already too late!    *Caution in patients with high blood pressure

## 2018-01-27 NOTE — Progress Notes (Signed)
HPI: Tammy Carrillo is a 63 y.o. female who  has a past medical history of Depression and Migraines.  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  No cough or runny nose. Sinus pressure is better.        Past medical, surgical, social and family history reviewed and updated as necessary.   Current medication list and allergy/intolerance information reviewed:    Current Outpatient Medications  Medication Sig Dispense Refill  . albuterol (PROVENTIL HFA;VENTOLIN HFA) 108 (90 Base) MCG/ACT inhaler Inhale 2 puffs into the lungs every 4 (four) hours as needed for wheezing or shortness of breath. 1 Inhaler 1  . venlafaxine XR (EFFEXOR-XR) 150 MG 24 hr capsule Take 150 mg by mouth daily.    Marland Kitchen amoxicillin-clavulanate (AUGMENTIN) 875-125 MG tablet Take 1 tablet by mouth 2 (two) times daily. One po bid x 7 days (Patient not taking: Reported on 01/27/2018) 14 tablet 0  . benzonatate (TESSALON) 100 MG capsule Take 1-2 capsules (100-200 mg total) by mouth every 8 (eight) hours. (Patient not taking: Reported on 01/27/2018) 21 capsule 0  . diclofenac sodium (VOLTAREN) 1 % GEL Apply 2-4 g topically 4 (four) times daily. To affected joint/low back (Patient not taking: Reported on 01/27/2018) 100 g 11   No current facility-administered medications for this visit.     No Known Allergies    Review of Systems:  Constitutional:  No  fever, no chills, +recent illness, No unintentional weight changes. No significant fatigue.   HEENT: No  headache, no vision change, no hearing change, No sore throat, +sinus pressure  Cardiac: No  chest pain, No  pressure, No  palpitations  Respiratory:  No  shortness of breath. No  Cough  Gastrointestinal: No  abdominal pain, No  nausea, No  vomiting,  No  blood in stool, No  diarrhea  Musculoskeletal: +generalized myalgia/arthralgia improved but still present   Skin: No  Rash  Neurologic: No  weakness, No  dizziness  Exam:  BP 127/62 (BP Location: Left Arm, Patient Position: Sitting, Cuff Size: Normal)   Pulse 62   Temp (!) 97.5 F (36.4 C) (Oral)   Wt 188 lb 14.4 oz (85.7 kg)   SpO2 97%   BMI 29.59 kg/m   Constitutional: VS see above. General Appearance: alert, well-developed, well-nourished, NAD  Eyes: Normal lids and conjunctive, non-icteric sclera  Ears, Nose, Mouth, Throat: MMM, Normal external inspection ears/nares/mouth/lips/gums. TM normal bilaterally w/ mild clear effusion on L. Pharynx/tonsils +erythema, no exudate. Nasal mucosa normal.   Neck: No masses, trachea midline. No tenderness/mass appreciated. No lymphadenopathy  Respiratory: Normal respiratory effort. no wheeze, no rhonchi, no rales  Cardiovascular: S1/S2 normal, no murmur, no rub/gallop auscultated. RRR. No lower extremity edema.   Gastrointestinal: Nontender, no masses. Bowel sounds normal.  Musculoskeletal: Gait normal.   Neurological: Normal balance/coordination. No tremor.   Skin: warm, dry, intact.   Psychiatric: Normal judgment/insight. Normal mood and affect.     ASSESSMENT/PLAN: The primary encounter diagnosis was Acute non-recurrent sinusitis, unspecified location. A diagnosis of Viral upper respiratory tract infection was also pertinent to this visit.   Meds ordered this encounter  Medications  . predniSONE (DELTASONE) 20 MG tablet  Sig: Take 1 tablet (20 mg total) by mouth 2 (two) times daily with a meal.    Dispense:  10 tablet    Refill:  0  . ipratropium (ATROVENT) 0.06 % nasal spray    Sig: Place 2 sprays into both nostrils 4 (four) times daily.    Dispense:  15 mL    Refill:  1  .  doxycycline (VIBRA-TABS) 100 MG tablet    Sig: Take 1 tablet (100 mg total) by mouth 2 (two) times daily. Fill and take if sinus pain worsens despite other treatment    Dispense:  14 tablet    Refill:  0       Patient Instructions  Medications & Home Remedies for Upper Respiratory Illness   Note: the following list assumes no pregnancy, normal liver & kidney function and no other drug interactions. Dr. Sheppard Coil has highlighted medications which are safe for you to use, but these may not be appropriate for everyone. Always ask a pharmacist or qualified medical provider if you have any questions!    Aches/Pains, Fever, Headache OTC Acetaminophen (Tylenol) 500 mg tablets - take max 2 tablets (1000 mg) every 6 hours (4 times per day)  OTC Ibuprofen (Motrin) 200 mg tablets - take max 4 tablets (800 mg) every 6 hours*   Sinus Congestion Prescription Atrovent as directed OTC Nasal Saline if desired to rinse OTC Oxymetolazone (Afrin, others) sparing use due to rebound congestion, NEVER use in kids OTC Phenylephrine (Sudafed) 10 mg tablets every 4 hours (or the 12-hour formulation)* OTC Diphenhydramine (Benadryl) 25 mg tablets - take max 2 tablets every 4 hours   Cough & Sore Throat OTC Dextromethorphan (Robitussin, others) - cough suppressant OTC Guaifenesin (Robitussin, Mucinex, others) - expectorant (helps cough up mucus) (Dextromethorphan and Guaifenesin also come in a combination tablet/syrup) OTC Lozenges w/ Benzocaine + Menthol (Cepacol) Honey - as much as you want! Teas which "coat the throat" - look for ingredients Elm Bark, Licorice Root, Marshmallow Root   Other Prescription Oral Steroids to decrease inflammation and improve energy Prescription Antibiotics if these are necessary for bacterial infection - take ALL, even if you're feeling better  OTC Zinc Lozenges within 24 hours of symptoms onset - mixed evidence this shortens the duration of the common cold Don't waste your  money on Vitamin C or Echinacea in acute illness - it's already too late!    *Caution in patients with high blood pressure         Visit summary with medication list and pertinent instructions was printed for patient to review. All questions at time of visit were answered - patient instructed to contact office with any additional concerns or updates. ER/RTC precautions were reviewed with the patient.   Follow-up plan: Return if symptoms worsen or fail to improve.    Please note: voice recognition software was used to produce this document, and typos may escape review. Please contact Dr. Sheppard Coil for any needed clarifications.

## 2018-03-09 ENCOUNTER — Encounter: Payer: Self-pay | Admitting: Osteopathic Medicine

## 2018-03-09 ENCOUNTER — Ambulatory Visit (INDEPENDENT_AMBULATORY_CARE_PROVIDER_SITE_OTHER): Payer: BC Managed Care – PPO | Admitting: Osteopathic Medicine

## 2018-03-09 VITALS — BP 143/70 | HR 76 | Temp 98.1°F | Wt 192.9 lb

## 2018-03-09 DIAGNOSIS — R197 Diarrhea, unspecified: Secondary | ICD-10-CM

## 2018-03-09 MED ORDER — DIPHENOXYLATE-ATROPINE 2.5-0.025 MG PO TABS: 1.0000 | ORAL_TABLET | Freq: Four times a day (QID) | ORAL | 0 refills | Status: DC | PRN

## 2018-03-09 NOTE — Patient Instructions (Signed)
Will forward records to Dr Glennon Hamilton, see if he has any suggestions or if this impacts your upcoming procedure.   Will get stool studies and blood work.   Can try Lomotil Rx for severe symptoms.   If fever, pain, severe bloody stool, other concerns please seek emergency medical care!

## 2018-03-09 NOTE — Progress Notes (Signed)
HPI: Tammy Carrillo is a 63 y.o. female who  has a past medical history of Depression and Migraines.  she presents to [facility] today, 03/09/18,  for chief complaint of: Diarrhea  Loose watery stool ongoing for 21 days (since around January 22).  Few episodes of fecal incontinence.  No blood in the stool.  No abdominal pain, no fever.  No recent travel or other sick contacts.  Recent antibiotics for persistent sinusitis but this was in early January.  Patient has a colonoscopy and EGD coming up on Monday, 5 days from now.  Following with Dr. Glennon Hamilton.     Past medical, surgical, social and family history reviewed:  Patient Active Problem List   Diagnosis Date Noted  . Migraine headache 09/02/2011  . Depression 03/13/2011    Past Surgical History:  Procedure Laterality Date  . CHOLECYSTECTOMY      Social History   Tobacco Use  . Smoking status: Former Smoker    Packs/day: 0.50    Years: 5.00    Pack years: 2.50    Types: Cigarettes    Last attempt to quit: 01/26/1985    Years since quitting: 33.1  . Smokeless tobacco: Never Used  Substance Use Topics  . Alcohol use: No    Family History  Problem Relation Age of Onset  . Cancer Father   . Stroke Father   . Diabetes Father   . Cancer Sister        breast  . Asthma Mother   . Stroke Maternal Grandmother   . Heart attack Paternal Grandmother      Current medication list and allergy/intolerance information reviewed:    Current Meds  Medication Sig  . lansoprazole (PREVACID) 30 MG capsule Take by mouth.  . venlafaxine XR (EFFEXOR-XR) 150 MG 24 hr capsule Take 150 mg by mouth daily.     No Known Allergies    Review of Systems:  Constitutional:  No  fever, no chills, No unintentional weight changes. No significant fatigue.   HEENT: No  headache, no vision change  Cardiac: No  chest pain  Respiratory:  No  shortness of breath. No  Cough  Gastrointestinal: No  abdominal  pain, No  nausea, No  vomiting,  No  blood in stool, +diarrhea, No  constipation   Musculoskeletal: No new myalgia/arthralgia  Skin: No  Rash  Hem/Onc: No  easy bruising/bleeding   Exam:  BP (!) 143/70 (BP Location: Left Arm, Patient Position: Sitting, Cuff Size: Normal)   Pulse 76   Temp 98.1 F (36.7 C) (Oral)   Wt 192 lb 14.4 oz (87.5 kg)   BMI 30.21 kg/m   Constitutional: VS see above. General Appearance: alert, well-developed, well-nourished, NAD  Eyes: Normal lids and conjunctive, non-icteric sclera  Ears, Nose, Mouth, Throat: MMM, Normal external inspection ears/nares/mouth/lips/gums.   Neck: No masses, trachea midline. No thyroid enlargement. No tenderness/mass appreciated. No lymphadenopathy  Respiratory: Normal respiratory effort. no wheeze, no rhonchi, no rales  Cardiovascular: S1/S2 normal, no murmur, no rub/gallop auscultated. RRR. No lower extremity edema.   Gastrointestinal: Nontender, no masses. No hepatomegaly, no splenomegaly. No hernia appreciated. Bowel sounds normal. Rectal exam deferred.   Musculoskeletal: Gait normal.  Skin: warm, dry, intact.   Psychiatric: Normal judgment/insight. Normal mood and affect. Oriented x3.      ASSESSMENT/PLAN: The encounter diagnosis was Diarrhea, unspecified type.   Orders Placed This Encounter  Procedures  . Stool Culture  . Ova and parasite examination  .  Stool, WBC/Lactoferrin  . C. difficile GDH and Toxin A/B  . CBC  . COMPLETE METABOLIC PANEL WITH GFR  . Lipase  . TSH    Meds ordered this encounter  Medications  . diphenoxylate-atropine (LOMOTIL) 2.5-0.025 MG tablet    Sig: Take 1 tablet by mouth 4 (four) times daily as needed for diarrhea or loose stools.    Dispense:  30 tablet    Refill:  0    Patient Instructions  Will forward records to Dr Glennon Hamilton, see if he has any suggestions or if this impacts your upcoming procedure.   Will get stool studies and blood work.   Can try Lomotil Rx for  severe symptoms.   If fever, pain, severe bloody stool, other concerns please seek emergency medical care!          Visit summary with medication list and pertinent instructions was printed for patient to review. All questions at time of visit were answered - patient instructed to contact office with any additional concerns or updates. ER/RTC precautions were reviewed with the patient.    Please note: voice recognition software was used to produce this document, and typos may escape review. Please contact Dr. Sheppard Coil for any needed clarifications.     Follow-up plan: Return if symptoms worsen or fail to improve.

## 2018-03-10 LAB — COMPLETE METABOLIC PANEL WITH GFR
AG Ratio: 1.7 (calc) (ref 1.0–2.5)
ALT: 10 U/L (ref 6–29)
AST: 15 U/L (ref 10–35)
Albumin: 4 g/dL (ref 3.6–5.1)
Alkaline phosphatase (APISO): 68 U/L (ref 37–153)
BILIRUBIN TOTAL: 0.3 mg/dL (ref 0.2–1.2)
BUN: 15 mg/dL (ref 7–25)
CHLORIDE: 106 mmol/L (ref 98–110)
CO2: 26 mmol/L (ref 20–32)
Calcium: 9.5 mg/dL (ref 8.6–10.4)
Creat: 0.78 mg/dL (ref 0.50–0.99)
GFR, EST AFRICAN AMERICAN: 94 mL/min/{1.73_m2} (ref >=60)
GFR, Est Non African American: 81 mL/min/{1.73_m2} (ref >=60)
GLUCOSE: 81 mg/dL (ref 65–99)
Globulin: 2.3 g/dL (calc) (ref 1.9–3.7)
Potassium: 4.3 mmol/L (ref 3.5–5.3)
Sodium: 142 mmol/L (ref 135–146)
Total Protein: 6.3 g/dL (ref 6.1–8.1)

## 2018-03-10 LAB — CBC
HCT: 40.6 % (ref 35.0–45.0)
Hemoglobin: 14 g/dL (ref 11.7–15.5)
MCH: 31.3 pg (ref 27.0–33.0)
MCHC: 34.5 g/dL (ref 32.0–36.0)
MCV: 90.6 fL (ref 80.0–100.0)
MPV: 11 fL (ref 7.5–12.5)
PLATELETS: 306 10*3/uL (ref 140–400)
RBC: 4.48 10*6/uL (ref 3.80–5.10)
RDW: 12.2 % (ref 11.0–15.0)
WBC: 8.4 10*3/uL (ref 3.8–10.8)

## 2018-03-10 LAB — TSH: TSH: 0.51 mIU/L (ref 0.40–4.50)

## 2018-03-10 LAB — LIPASE: Lipase: 25 U/L (ref 7–60)

## 2018-03-11 LAB — FECAL LACTOFERRIN, QUANT
FECAL LACTOFERRIN: POSITIVE — AB
MICRO NUMBER: 191097
SPECIMEN QUALITY:: ADEQUATE

## 2018-03-11 LAB — C. DIFFICILE GDH AND TOXIN A/B
GDH ANTIGEN: NOT DETECTED
MICRO NUMBER:: 191096
SPECIMEN QUALITY:: ADEQUATE
TOXIN A AND B: NOT DETECTED

## 2018-03-16 LAB — STOOL CULTURE
MICRO NUMBER: 191093
MICRO NUMBER:: 191092
MICRO NUMBER:: 191095
SHIGA RESULT:: NOT DETECTED
SPECIMEN QUALITY: ADEQUATE
SPECIMEN QUALITY: ADEQUATE
SPECIMEN QUALITY:: ADEQUATE

## 2018-03-16 LAB — OVA AND PARASITE EXAMINATION
CONCENTRATE RESULT:: NONE SEEN
SPECIMEN QUALITY:: ADEQUATE
TRICHROME RESULT:: NONE SEEN
VKL: 191094

## 2018-03-18 ENCOUNTER — Encounter: Payer: Self-pay | Admitting: Osteopathic Medicine

## 2018-03-18 ENCOUNTER — Ambulatory Visit: Payer: BC Managed Care – PPO | Admitting: Osteopathic Medicine

## 2018-03-18 VITALS — BP 129/66 | HR 71 | Temp 98.0°F | Wt 188.9 lb

## 2018-03-18 DIAGNOSIS — K591 Functional diarrhea: Secondary | ICD-10-CM

## 2018-03-18 DIAGNOSIS — H6092 Unspecified otitis externa, left ear: Secondary | ICD-10-CM | POA: Diagnosis not present

## 2018-03-18 MED ORDER — RIFAXIMIN 550 MG PO TABS: 550.0000 mg | ORAL_TABLET | Freq: Three times a day (TID) | ORAL | 0 refills | Status: AC

## 2018-03-18 MED ORDER — ELUXADOLINE 100 MG PO TABS: 100.0000 mg | ORAL_TABLET | Freq: Two times a day (BID) | ORAL | 5 refills | Status: DC

## 2018-03-18 MED ORDER — CIPROFLOXACIN-DEXAMETHASONE 0.3-0.1 % OT SUSP
4.0000 [drp] | Freq: Two times a day (BID) | OTIC | 0 refills | Status: AC
Start: 2018-03-18 — End: 2018-03-25

## 2018-03-18 NOTE — Progress Notes (Signed)
HPI: Tammy Carrillo is a 63 y.o. female who  has a past medical history of Depression and Migraines.  she presents to [facility] today, 03/18/18,  for chief complaint of:  Ear concern   In L ear, felt like something went in, then tried Qtip few days later and noted some has pus coming out, now feels like a couple bumps in her ear. Overall better but concerned about the bumps.   Diarrhea is still bothering her! Mostly watery, occasional incontinence, mentioned this to Dr Glennon Hamilton (GI) prior to colonoscopy.      At today's visit 03/18/18 ... PMH, PSH, FH reviewed and updated as needed.  Current medication list and allergy/intolerance hx reviewed and updated as needed. (See remainder of HPI, ROS, Phys Exam below)        ASSESSMENT/PLAN: The primary encounter diagnosis was Otitis externa of left ear, unspecified chronicity, unspecified type. 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   Meds ordered this encounter  Medications  . Eluxadoline (VIBERZI) 100 MG TABS    Sig: Take 1 tablet (100 mg total) by mouth 2 (two) times daily.    Dispense:  60 tablet    Refill:  5  . rifaximin (XIFAXAN) 550 MG TABS tablet    Sig: Take 1 tablet (550 mg total) by mouth 3 (three) times daily for 14 days.    Dispense:  42 tablet    Refill:  0  . ciprofloxacin-dexamethasone (CIPRODEX) OTIC suspension    Sig: Place 4 drops into the left ear 2 (two) times daily for 7 days.    Dispense:  7.5 mL    Refill:  0    Patient Instructions  Will try Rx for diarrhea - will treat as irritable bowel Acute or breakthrough diarrhea:  OK to try the Lomotil Rx (sent prior) Irritable bowel w/ Diarrhea treatments:   Can try Xifaxan - antibiotic which may help "reset" things  Can try Viberzi - antidiarrheal   Ear drops sent for ear pain/irritation, will also treat possible mild infection of skin. Keep ears clean, don't worry about using Qtips or  anything into the ear!   Call me if no better in 1-2 weeks, sooner if worse/change!       Follow-up plan: Return if symptoms worsen or fail to improve.                                                 ################################################# ################################################# ################################################# #################################################    Current Meds  Medication Sig  . diphenoxylate-atropine (LOMOTIL) 2.5-0.025 MG tablet Take 1 tablet by mouth 4 (four) times daily as needed for diarrhea or loose stools.  . lansoprazole (PREVACID) 30 MG capsule Take by mouth.  . venlafaxine XR (EFFEXOR-XR) 150 MG 24 hr capsule Take 150 mg by mouth daily.    No Known Allergies     Review of Systems:  Constitutional: No fever/chills  HEENT: No  headache, no vision change, +ear per HPI  Cardiac: No  chest pain  Respiratory:  No  shortness of breath. No  Cough  Gastrointestinal: No  abdominal pain, +diarrhea per HPI  Musculoskeletal: No new myalgia/arthralgia  Skin: +Rash in ear  Hem/Onc: No  easy bruising/bleeding  Neurologic: No  weakness, No  Dizziness   Exam:  BP 129/66 (BP Location: Left Arm,  Patient Position: Sitting, Cuff Size: Normal)   Pulse 71   Temp 98 F (36.7 C) (Oral)   Wt 188 lb 14.4 oz (85.7 kg)   BMI 29.59 kg/m   Constitutional: VS see above. General Appearance: alert, well-developed, well-nourished, NAD  Eyes: Normal lids and conjunctive, non-icteric sclera  Ears, Nose, Mouth, Throat: MMM, Normal external inspection ears/nares/mouth/lips/gums. Normal TM on L, canal appears mild red and irritated, excoriation on upper distal canal, normal skin otherwise,   Neck: No masses, trachea midline.   Respiratory: Normal respiratory effort.   Musculoskeletal: Gait normal. Symmetric and independent movement of all extremities  Abdominal: non-tender,  non-distended  Neurological: Normal balance/coordination. No tremor.  Skin: warm, dry, intact.   Psychiatric: Normal judgment/insight. Normal mood and affect. Oriented x3.       Visit summary with medication list and pertinent instructions was printed for patient to review, patient was advised to alert [place] if any updates are needed. All questions at time of visit were answered - patient instructed to contact office with any additional concerns. ER/RTC precautions were reviewed with the patient and understanding verbalized.     Please note: voice recognition software was used to produce this document, and typos may escape review. Please contact Dr. Sheppard Coil for any needed clarifications.    Follow up plan: Return if symptoms worsen or fail to improve.

## 2018-03-18 NOTE — Patient Instructions (Addendum)
Will try Rx for diarrhea - will treat as irritable bowel Acute or breakthrough diarrhea:  OK to try the Lomotil Rx (sent prior) Irritable bowel w/ Diarrhea treatments:   Can try Xifaxan - antibiotic which may help "reset" things  Can try Viberzi - antidiarrheal   Ear drops sent for ear pain/irritation, will also treat possible mild infection of skin. Keep ears clean, don't worry about using Qtips or anything into the ear!   Call me if no better in 1-2 weeks, sooner if worse/change!

## 2018-05-25 ENCOUNTER — Telehealth: Payer: Self-pay | Admitting: Osteopathic Medicine

## 2018-05-25 ENCOUNTER — Ambulatory Visit: Payer: BC Managed Care – PPO | Admitting: Osteopathic Medicine

## 2018-05-25 NOTE — Telephone Encounter (Signed)
Pt called. She has a sinus infection - headaches, low grade fever and sore throat. I apologize for not transferring to Triage first. She is on the schedule at 2:00 today(pending webex). Please advise.

## 2018-05-25 NOTE — Telephone Encounter (Signed)
Thank you :)

## 2018-05-25 NOTE — Telephone Encounter (Signed)
Will address at web visit.

## 2018-05-25 NOTE — Telephone Encounter (Signed)
Forwarding to provider for review.

## 2018-05-26 ENCOUNTER — Ambulatory Visit: Payer: BC Managed Care – PPO | Admitting: Osteopathic Medicine

## 2018-05-26 ENCOUNTER — Encounter: Payer: Self-pay | Admitting: Osteopathic Medicine

## 2018-05-26 ENCOUNTER — Ambulatory Visit (INDEPENDENT_AMBULATORY_CARE_PROVIDER_SITE_OTHER): Payer: BC Managed Care – PPO | Admitting: Osteopathic Medicine

## 2018-05-26 VITALS — BP 132/82 | HR 62 | Wt 175.0 lb

## 2018-05-26 DIAGNOSIS — J060 Acute laryngopharyngitis: Secondary | ICD-10-CM | POA: Diagnosis not present

## 2018-05-26 DIAGNOSIS — J019 Acute sinusitis, unspecified: Secondary | ICD-10-CM | POA: Diagnosis not present

## 2018-05-26 MED ORDER — IPRATROPIUM BROMIDE 0.06 % NA SOLN: 2.0000 | Freq: Four times a day (QID) | NASAL | 1 refills | Status: DC

## 2018-05-26 MED ORDER — AMOXICILLIN-POT CLAVULANATE 875-125 MG PO TABS: 1.0000 | ORAL_TABLET | Freq: Two times a day (BID) | ORAL | 0 refills | Status: AC

## 2018-05-26 NOTE — Progress Notes (Signed)
Virtual Visit  via Phone Note  I connected with      Tammy Carrillo on 05/26/18 at 10:10 by a telemedicine application and verified that I am speaking with the correct person using two identifiers.   I discussed the limitations of evaluation and management by telemedicine and the availability of in person appointments. The patient expressed understanding and agreed to proceed.  History of Present Illness: Tammy Carrillo is a 63 y.o. female who would like to discuss sinus infection    . Location/Quality: sore throat is the worse symptom  . Duration: 6 days . Context: thought might be allergies but she doesn't  . Modifying factors: sudafed helping a bit . Assoc signs/symptoms: feels like uvula [place] beefy red, frontal headaches, sinus pain    MODIFIED CENTOR CRITERIA (ponts if "yes"): Tonsillar exudate (1): no Tender Ant Cervical LN (1): yes Absence of cough (1): yes Fever (1): subjective Age  >/= 45 (-1): yes SCORE: TREATMENT: 2 - 3 = TEST, TX (+)SWAB, CX (-)SWAB Unable to swab in virtual visit       Observations/Objective: BP 132/82 (Patient Position: Sitting, Cuff Size: Normal)   Pulse 62   Wt 175 lb (79.4 kg)   BMI 27.41 kg/m  BP Readings from Last 3 Encounters:  05/26/18 132/82  03/18/18 129/66  03/09/18 (!) 143/70   Exam: Normal Speech.    Lab and Radiology Results No results found for this or any previous visit (from the past 72 hour(s)). No results found.     Assessment and Plan: 63 y.o. female with The primary encounter diagnosis was Acute non-recurrent sinusitis, unspecified location. A diagnosis of Sore throat and laryngitis was also pertinent to this visit.   PDMP not reviewed this encounter. No orders of the defined types were placed in this encounter.  Meds ordered this encounter  Medications  . amoxicillin-clavulanate (AUGMENTIN) 875-125 MG tablet    Sig: Take 1 tablet by mouth 2 (two) times daily for 7 days.    Dispense:  14  tablet    Refill:  0  . ipratropium (ATROVENT) 0.06 % nasal spray    Sig: Place 2 sprays into both nostrils 4 (four) times daily.    Dispense:  15 mL    Refill:  1      Follow Up Instructions: Return if symptoms worsen or fail to improve. will need to be office visit     I discussed the assessment and treatment plan with the patient. The patient was provided an opportunity to ask questions and all were answered. The patient agreed with the plan and demonstrated an understanding of the instructions.   The patient was advised to call back or seek an in-person evaluation if the symptoms worsen or if the condition fails to improve as anticipated.  Provided 21 minutes of non-face-to-face time during this encounter.                      Historical information moved to improve visibility of documentation.  Past Medical History:  Diagnosis Date  . Depression   . Migraines    Past Surgical History:  Procedure Laterality Date  . CHOLECYSTECTOMY     Social History   Tobacco Use  . Smoking status: Former Smoker    Packs/day: 0.50    Years: 5.00    Pack years: 2.50    Types: Cigarettes    Last attempt to quit: 01/26/1985    Years since quitting: 33.3  .  Smokeless tobacco: Never Used  Substance Use Topics  . Alcohol use: No   family history includes Asthma in her mother; Cancer in her father and sister; Diabetes in her father; Heart attack in her paternal grandmother; Stroke in her father and maternal grandmother.  Medications: Current Outpatient Medications  Medication Sig Dispense Refill  . lansoprazole (PREVACID) 30 MG capsule Take by mouth.    . venlafaxine XR (EFFEXOR-XR) 150 MG 24 hr capsule Take 150 mg by mouth daily.    Marland Kitchen amoxicillin-clavulanate (AUGMENTIN) 875-125 MG tablet Take 1 tablet by mouth 2 (two) times daily for 7 days. 14 tablet 0  . ipratropium (ATROVENT) 0.06 % nasal spray Place 2 sprays into both nostrils 4 (four) times daily. 15 mL 1   No  current facility-administered medications for this visit.    No Known Allergies  PDMP not reviewed this encounter. No orders of the defined types were placed in this encounter.  Meds ordered this encounter  Medications  . amoxicillin-clavulanate (AUGMENTIN) 875-125 MG tablet    Sig: Take 1 tablet by mouth 2 (two) times daily for 7 days.    Dispense:  14 tablet    Refill:  0  . ipratropium (ATROVENT) 0.06 % nasal spray    Sig: Place 2 sprays into both nostrils 4 (four) times daily.    Dispense:  15 mL    Refill:  1

## 2018-08-01 ENCOUNTER — Other Ambulatory Visit: Payer: Self-pay | Admitting: Osteopathic Medicine

## 2018-11-15 IMAGING — DX DG HIP (WITH OR WITHOUT PELVIS) 2-3V*L*
3 series · 3 of 3 positions shown · non-contrast
Comparison: None.

CLINICAL DATA: Left hip pain for 1 month.

EXAM:
DG HIP (WITH OR WITHOUT PELVIS) 2-3V LEFT

[pelvis ap]
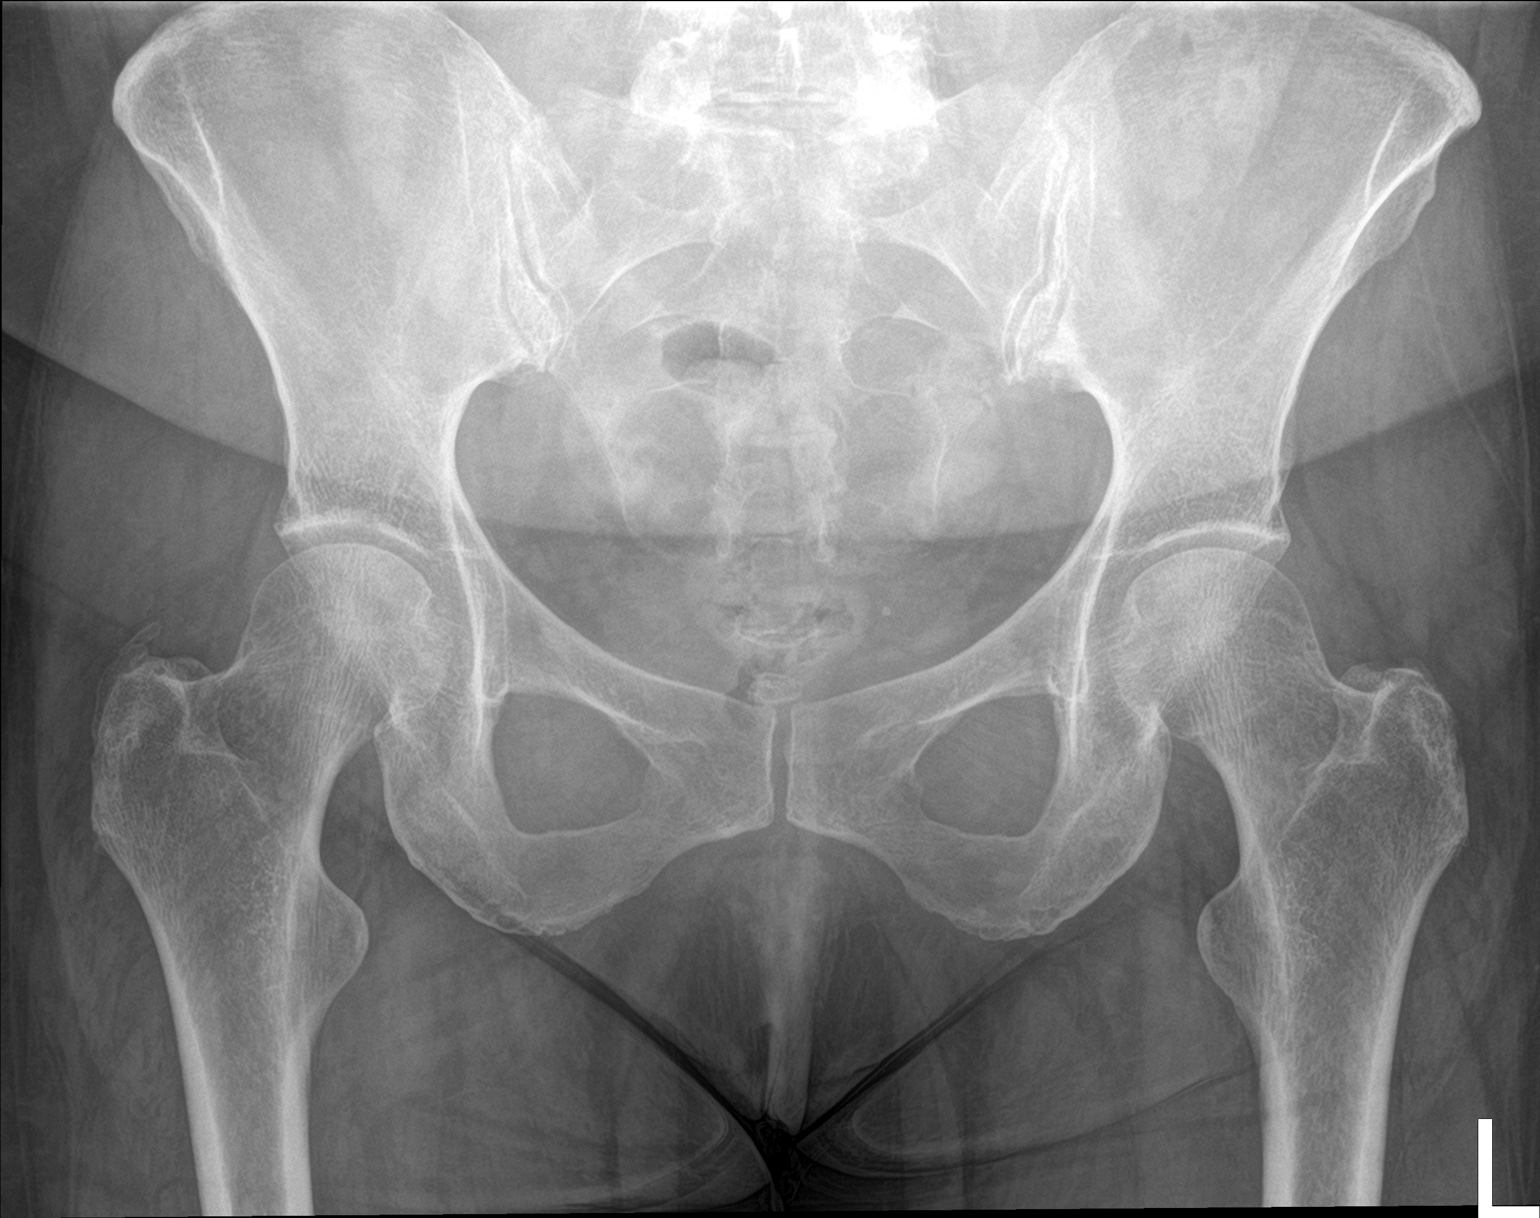

[hip ap]
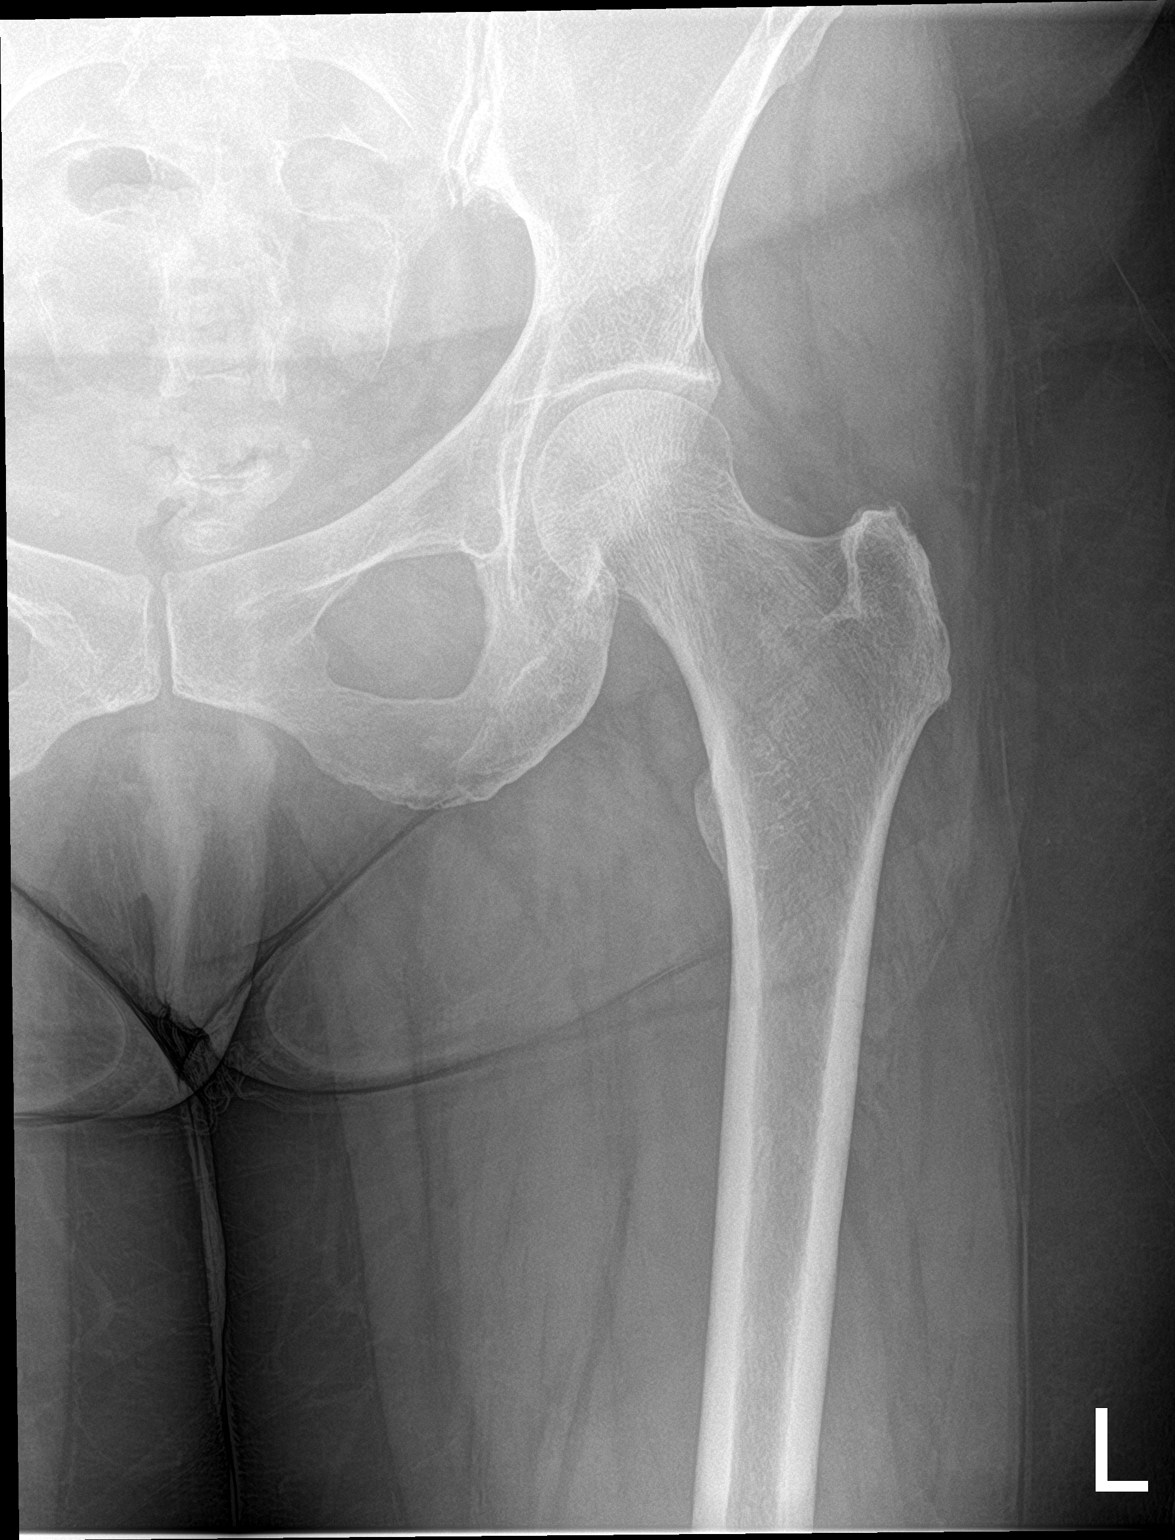

[hip lat]
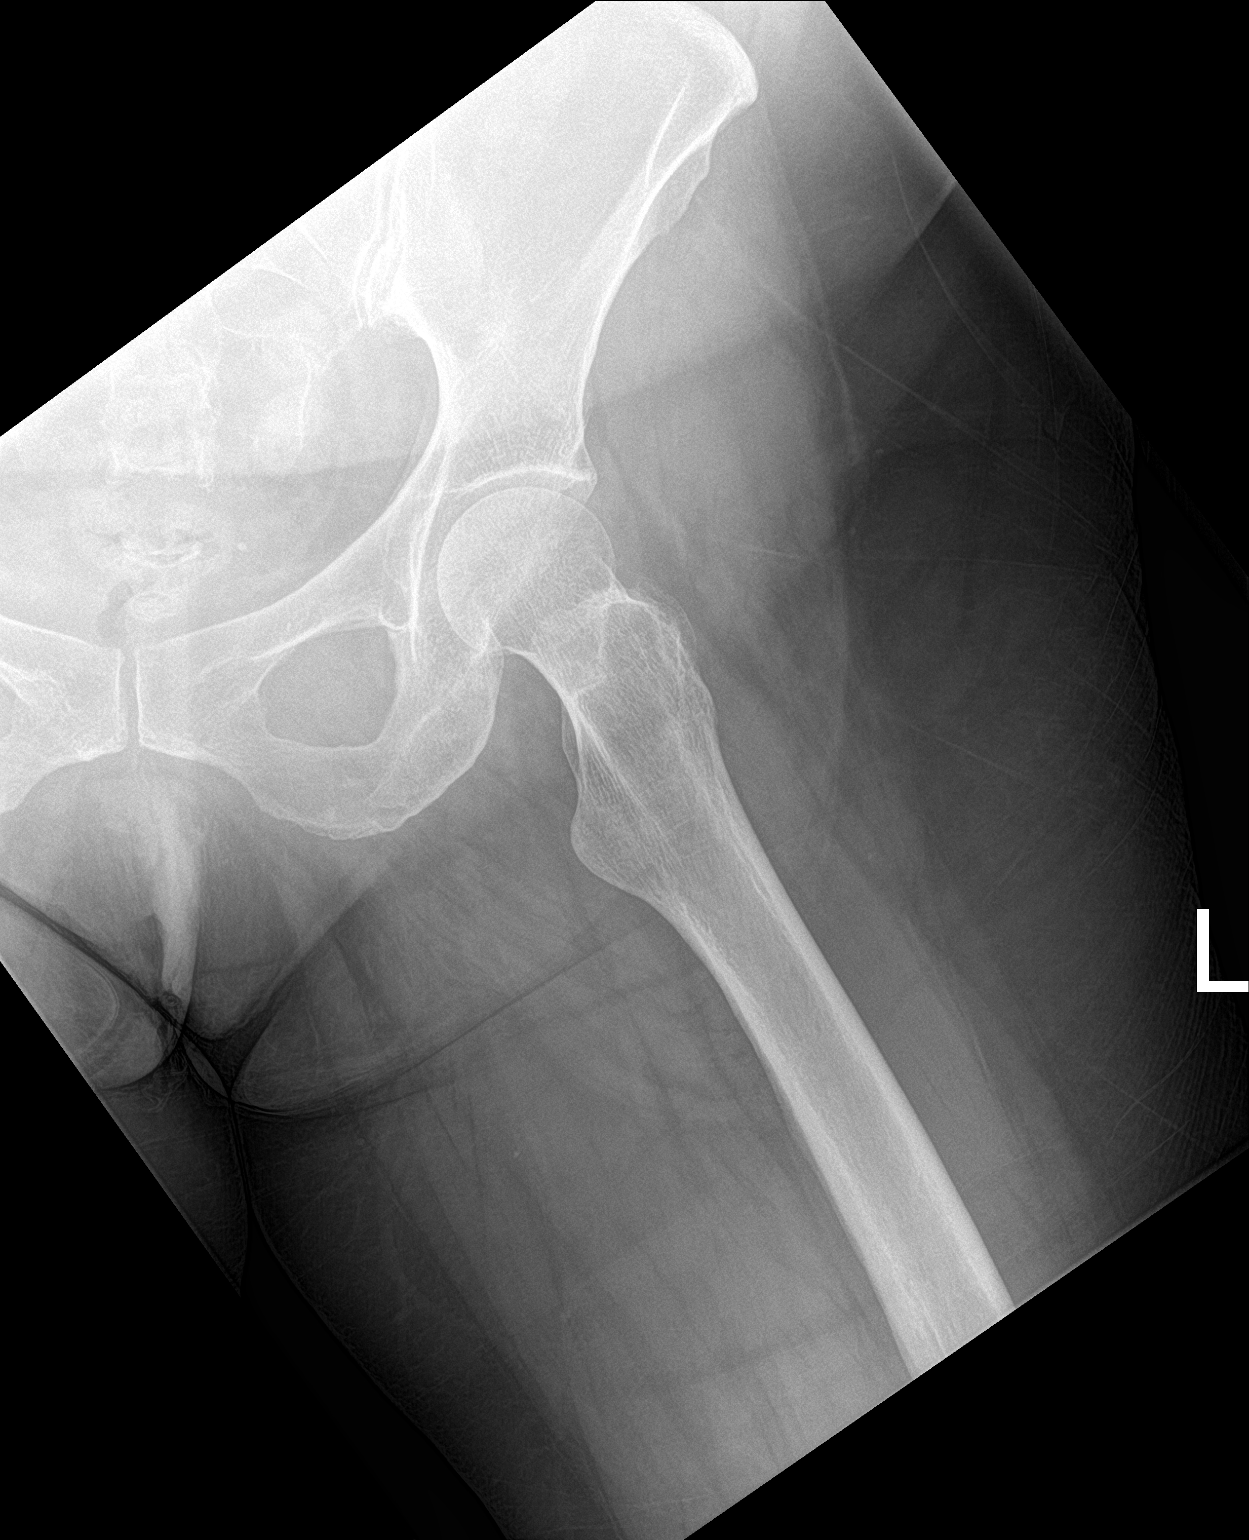

[3 of 3 positions shown; findings below may reference images not displayed]

FINDINGS: There is no evidence of hip fracture or dislocation. There is no
evidence of significant arthropathy or other focal bone abnormality.
IMPRESSION: Negative.

## 2018-11-30 ENCOUNTER — Telehealth: Payer: Self-pay | Admitting: Osteopathic Medicine

## 2018-11-30 NOTE — Telephone Encounter (Signed)
Noted. Thanks for the update.  

## 2018-11-30 NOTE — Telephone Encounter (Signed)
Patient is calling in stating that she has had severe sinus pressure and headaches. States that she gets this every year. Would like something called in. States that's what usually happens and Dr.Alexander calls something in. Please advise.

## 2018-11-30 NOTE — Telephone Encounter (Signed)
Pt will need to schedule a visit for evaluation. Dr. Sheppard Coil will not prescribe antibiotics beforehand. Have pt schedule a virtual visit. Thanks.

## 2018-11-30 NOTE — Telephone Encounter (Signed)
Appointment was made with Dr.Metheney tomorrow for the requested time of the patient since we are not sure if PCP will be doing virtual tomorrow. No further questions at this time.

## 2018-12-01 ENCOUNTER — Ambulatory Visit (INDEPENDENT_AMBULATORY_CARE_PROVIDER_SITE_OTHER): Payer: BC Managed Care – PPO | Admitting: Family Medicine

## 2018-12-01 ENCOUNTER — Encounter: Payer: Self-pay | Admitting: Family Medicine

## 2018-12-01 VITALS — Temp 97.4°F | Ht 67.0 in | Wt 175.0 lb

## 2018-12-01 DIAGNOSIS — J0120 Acute ethmoidal sinusitis, unspecified: Secondary | ICD-10-CM

## 2018-12-01 MED ORDER — AMOXICILLIN-POT CLAVULANATE 875-125 MG PO TABS: 1.0000 | ORAL_TABLET | Freq: Two times a day (BID) | ORAL | 0 refills | Status: DC

## 2018-12-01 NOTE — Progress Notes (Signed)
Virtual Visit via Telephone Note  I connected with Tammy Carrillo on 12/01/18 at  3:20 PM EST by telephone and verified that I am speaking with the correct person using two identifiers.   I discussed the limitations, risks, security and privacy concerns of performing an evaluation and management service by telephone and the availability of in person appointments. I also discussed with the patient that there may be a patient responsible charge related to this service. The patient expressed understanding and agreed to proceed.   Subjective:    CC: sinus sxs.    HPI: Patient complains of severe headache and sinus congestion x1 week.  She says it started last Thursday when the power went out.  She complains now that she is getting dark green thick mucus.  She is had a lot of facial tenderness particularly over the nasal bridge.  She has been using Sudafed, hot tea and occasional Afrin.  She says her left ear has been bothering her intermittently with a sharp jabbing pain but no drainage.  The throat is starting to get a little bit sore from postnasal drip but she denies any cough, shortness of breath, diarrhea, or loss of taste or smell.  She denies any fevers chills or sweats.  She says she does typically get a sinus infection around this time of year.   Past medical history, Surgical history, Family history not pertinant except as noted below, Social history, Allergies, and medications have been entered into the medical record, reviewed, and corrections made.   Review of Systems: No fevers, chills, night sweats, weight loss, chest pain, or shortness of breath.   Objective:    General: Speaking clearly in complete sentences without any shortness of breath.  Alert and oriented x3.  Normal judgment. No apparent acute distress.    Impression and Recommendations:    Acute sinusitis-x1 week.  Will treat with Augmentin.  Call if not significantly better in 1 week.  Okay to continue symptomatic  care over-the-counter.  She actually has scheduled an appointment with her OB/GYN to get her Pap smear and mammogram up-to-date.  She declined flu vaccine today.      I discussed the assessment and treatment plan with the patient. The patient was provided an opportunity to ask questions and all were answered. The patient agreed with the plan and demonstrated an understanding of the instructions.   The patient was advised to call back or seek an in-person evaluation if the symptoms worsen or if the condition fails to improve as anticipated.  I provided 15 minutes of non-face-to-face time during this encounter.   Beatrice Lecher, MD

## 2018-12-21 ENCOUNTER — Ambulatory Visit (INDEPENDENT_AMBULATORY_CARE_PROVIDER_SITE_OTHER): Payer: BC Managed Care – PPO | Admitting: Physician Assistant

## 2018-12-21 ENCOUNTER — Encounter: Payer: Self-pay | Admitting: Physician Assistant

## 2018-12-21 VITALS — BP 122/78 | HR 58 | Temp 98.4°F | Resp 14 | Ht 67.0 in | Wt 172.0 lb

## 2018-12-21 DIAGNOSIS — R0981 Nasal congestion: Secondary | ICD-10-CM

## 2018-12-21 DIAGNOSIS — R5381 Other malaise: Secondary | ICD-10-CM | POA: Diagnosis not present

## 2018-12-21 DIAGNOSIS — R002 Palpitations: Secondary | ICD-10-CM | POA: Insufficient documentation

## 2018-12-21 DIAGNOSIS — R42 Dizziness and giddiness: Secondary | ICD-10-CM

## 2018-12-21 NOTE — Progress Notes (Signed)
Doesn't feel as bad as yesterday No chills/body aches/no cough/ no congestion/ no sore throat 2 student's family's have Covid, but students don't   Feels like her symptoms from yesterday were heart related (dizziness/fatigue) Feels like heart skipping rhythm, some chest pain - been happening for a month   Patient will give BP/HR to [facility] at visit.

## 2018-12-21 NOTE — Progress Notes (Signed)
Patient ID: MARLAINE YA, female   DOB: 12-22-1955, 63 y.o.   MRN: [id number] .Marland KitchenVirtual Visit via Video Note  I connected with Lahoma Crocker on 12/21/18 at 11:10 AM EST by a video enabled telemedicine application and verified that I am speaking with the correct person using two identifiers.  Location: Patient: home Provider: clinic   I discussed the limitations of evaluation and management by telemedicine and the availability of in person appointments. The patient expressed understanding and agreed to proceed.  History of Present Illness: Patient is a 64 year old female who calls into the clinic with 2 days of feeling fatigued and dizzy.  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  She denies any chills, body aches, cough, sore throat.  She does have some congestion.  She denies any loss of smell or taste or GI symptoms.  She did just recently have a sinus infection and treated with an antibiotic.  She has been using some Atrovent as needed.  She has had a dull headache.  Patient does feel some better today.  She is concerned some with palpitation she has been having intermittently for the last month.  They are associated with some chest tightness.  They do not seem to be associated with exertion.  She has had no med changes.   .. Active Ambulatory Problems    Diagnosis Date Noted  . Depression 03/13/2011  . Migraine headache 09/02/2011  . Nose congested 12/21/2018  . Dizziness 12/21/2018  . Palpitations 12/21/2018   Resolved Ambulatory Problems    Diagnosis Date Noted  . No Resolved Ambulatory Problems   Past Medical History:  Diagnosis Date  . Migraines    Reviewed med, allergy, problem list.     Observations/Objective: No acute distress.  Normal breathing.  No cough.   .. Today's Vitals   12/21/18 1039  BP: 122/78  Pulse: (!) 58  Resp: 14  Temp: 98.4 F (36.9 C)  TempSrc: Oral  Weight: 172 lb (78 kg)  Height: 5\' 7"  (1.702 m)    Body mass index is 26.94 kg/m.    Assessment and Plan: Marland KitchenMarland KitchenJalayah was seen today for irregular heart beat.  Diagnoses and all orders for this visit:  Malaise  Palpitations  Dizziness  Nose congested  Pt does have some viral syndrome symptoms and exposure to covid. Suggest covid testing. Self isolate until test results.   Discussed symptomatic care. Make sure to stay hydrated. Use atrovent, tylenol cold sinus severe.   Unable to work up palpitations/chest pain. After negative covid testing come into office to have EKG and blood work. If worsening then go to ED.    Follow Up Instructions:    I discussed the assessment and treatment plan with the patient. The patient was provided an opportunity to ask questions and all were answered. The patient agreed with the plan and demonstrated an understanding of the instructions.   The patient was advised to call back or seek an in-person evaluation if the symptoms worsen or if the condition fails to improve as anticipated.  I provided 11 minutes of non-face-to-face time during this encounter.   Iran Planas, PA-C

## 2019-02-10 ENCOUNTER — Telehealth: Payer: Self-pay

## 2019-02-10 NOTE — Telephone Encounter (Signed)
Pt's husband left a vm msg requesting an appt for pt. John did not disclose reason of appt. However he mentioned that he will inform nurse reason of visit. Forwarding to Triage for scheduling and to maintain confidentiality. Thanks.

## 2019-02-13 ENCOUNTER — Ambulatory Visit (INDEPENDENT_AMBULATORY_CARE_PROVIDER_SITE_OTHER): Payer: BC Managed Care – PPO | Admitting: Medical-Surgical

## 2019-02-13 ENCOUNTER — Other Ambulatory Visit: Payer: Self-pay

## 2019-02-13 ENCOUNTER — Encounter: Payer: Self-pay | Admitting: Medical-Surgical

## 2019-02-13 VITALS — BP 121/79 | HR 72 | Temp 98.1°F | Ht 67.0 in | Wt 183.1 lb

## 2019-02-13 DIAGNOSIS — N611 Abscess of the breast and nipple: Secondary | ICD-10-CM

## 2019-02-13 NOTE — Progress Notes (Signed)
Subjective:    CC: right breast lesion  HPI: Very pleasant 64 year old female presenting today with reports of a lesion that developed on her right breast.  She noticed this on Friday and took a picture.  She will send a picture to [place] via MyChart.  She has been working in her basement and suspects a spider bite.  Lesion was nonpainful, reddened with a small yellow open area in the center.  Had a small amount of drainage on Friday but none since.  Has been using frankincense followed by myrrh daily after shower.  Denies fever, chills, myalgias.  No other lesions present.  I reviewed the past medical history, family history, social history, surgical history, and allergies today and no changes were needed.  Please see the problem list section below in epic for further details.  Past Medical History: Past Medical History:  Diagnosis Date  . Depression   . Migraines    Past Surgical History: Past Surgical History:  Procedure Laterality Date  . CHOLECYSTECTOMY     Social History: Social History   Socioeconomic History  . Marital status: Married    Spouse name: Not on file  . Number of children: Not on file  . Years of education: Not on file  . Highest education level: Not on file  Occupational History  . Occupation: Pharmacist, hospital  Tobacco Use  . Smoking status: Former Smoker    Packs/day: 0.50    Years: 5.00    Pack years: 2.50    Types: Cigarettes    Quit date: 01/26/1985    Years since quitting: 34.0  . Smokeless tobacco: Never Used  Substance and Sexual Activity  . Alcohol use: No  . Drug use: No  . Sexual activity: Yes    Partners: Male    Birth control/protection: None  Other Topics Concern  . Not on file  Social History Narrative  . Not on file   Social Determinants of Health   Financial Resource Strain:   . Difficulty of Paying Living Expenses: Not on file  Food Insecurity:   . Worried About Charity fundraiser in the Last Year: Not on file  . Ran Out of Food in  the Last Year: Not on file  Transportation Needs:   . Lack of Transportation (Medical): Not on file  . Lack of Transportation (Non-Medical): Not on file  Physical Activity:   . Days of Exercise per Week: Not on file  . Minutes of Exercise per Session: Not on file  Stress:   . Feeling of Stress : Not on file  Social Connections:   . Frequency of Communication with Friends and Family: Not on file  . Frequency of Social Gatherings with Friends and Family: Not on file  . Attends Religious Services: Not on file  . Active Member of Clubs or Organizations: Not on file  . Attends Archivist Meetings: Not on file  . Marital Status: Not on file   Family History: Family History  Problem Relation Age of Onset  . Cancer Father   . Stroke Father   . Diabetes Father   . Cancer Sister        breast  . Asthma Mother   . Stroke Maternal Grandmother   . Heart attack Paternal Grandmother    Allergies: No Known Allergies Medications: See med rec.  Review of Systems: No fevers, chills, night sweats, weight loss, chest pain, or shortness of breath.   Objective:    General: Well Developed, well  nourished, and in no acute distress.  Neuro: Alert and oriented x3.  HEENT: Normocephalic, atraumatic.  Skin: Warm and dry.  Right breast lesion without tenderness or fluctuance.  See clinical photo below.  Cardiac: Regular rate and rhythm, no murmurs rubs or gallops, no lower extremity edema.  Respiratory: Clear to auscultation bilaterally. Not using accessory muscles, speaking in full sentences.      Impression and Recommendations:    Right breast abscess Lesion has greatly improved from appearance on Friday.  Continue home care regimen.  Monitor for signs of infection.  If signs of infection develop or lesion does not resolve, may benefit from oral antibiotics.  Return if symptoms worsen or fail to improve.  ___________________________________________ Clearnce Sorrel, DNP, APRN,  FNP-BC [facility] [place]

## 2019-02-13 NOTE — Telephone Encounter (Signed)
Patient has been scheduled

## 2019-03-15 LAB — HM PAP SMEAR

## 2019-05-16 ENCOUNTER — Encounter: Payer: Self-pay | Admitting: Osteopathic Medicine

## 2019-08-24 ENCOUNTER — Encounter: Payer: Self-pay | Admitting: Osteopathic Medicine

## 2019-08-24 ENCOUNTER — Ambulatory Visit (INDEPENDENT_AMBULATORY_CARE_PROVIDER_SITE_OTHER): Payer: BC Managed Care – PPO | Admitting: Osteopathic Medicine

## 2019-08-24 VITALS — BP 122/77 | HR 65 | Wt 192.0 lb

## 2019-08-24 DIAGNOSIS — G44229 Chronic tension-type headache, not intractable: Secondary | ICD-10-CM | POA: Diagnosis not present

## 2019-08-24 DIAGNOSIS — G4459 Other complicated headache syndrome: Secondary | ICD-10-CM

## 2019-08-24 DIAGNOSIS — R002 Palpitations: Secondary | ICD-10-CM

## 2019-08-24 MED ORDER — DIVALPROEX SODIUM ER 500 MG PO TB24: ORAL_TABLET | ORAL | 0 refills | Status: DC

## 2019-08-24 NOTE — Patient Instructions (Addendum)
Trial Depakote to break migraine cycle Will get MRI brain to make sure no underlying issues May consider pharmacologic prevention (several options)   Can try for migraine prevention:  Feverfew  Butterbur   Magnesium 400-800 mg/day Mag Citrate (not Mag Oxide)  Note: may take several weeks to have any effect!

## 2019-08-24 NOTE — Progress Notes (Signed)
Tammy Carrillo is a 64 y.o. female who presents to  [place] [facility]  today, 08/24/19, seeking care for the following:  . Chronic daily headache, waking her up around 3:00 Am same time every day, responds to Excedrin Migraine then she feels ok throughout the day but happens every day almost a year now. Occasionally will last all day. Reports long Hx migraines, no abortive treatment attempted d/t concern for Rx. Tight, buzzing pain bilateral frontal, no nausea, no phonosensitivity, (+)photosensitivity. No vision changes other than floaters. No hearing change or weakness, does have occasional spinning/vertigo. Concerned also over chest heaviness and feeling of heart fluttering, few days ago after visiting medical massage, no CP on exertion, no SOB. No concerns today on EKG.         ASSESSMENT & PLAN with other pertinent findings:  The primary encounter diagnosis was Chronic tension-type headache, not intractable. A diagnosis of Palpitations was also pertinent to this visit.   Not meeting criteria for true migraine but close, concern for daily waking form sleep but no other red flags, possible medication overuse headache? Low threshold for neuro referral. [facility] prefers to keep Rx natural, see below.     No results found for this or any previous visit (from the past 24 hour(s)).  Patient Instructions  Trial Depakote to break migraine cycle Will get MRI brain to make sure no underlying issues May consider pharmacologic prevention (several options)   Can try for migraine prevention:  Feverfew  Butterbur   Magnesium 400-800 mg/day Mag Citrate (not Mag Oxide)  Note: may take several weeks to have any effect!      Orders Placed This Encounter  Procedures  . EKG 12-Lead    Meds ordered this encounter  Medications  . divalproex (DEPAKOTE ER) 500 MG 24 hr tablet    Sig: Depakote 1000 mg po qhs x5 days then 500 mg po qhs x5  days    Dispense:  15 tablet    Refill:  0       Follow-up instructions: Return if symptoms worsen or fail to improve.                                         BP 122/77 (BP Location: Left Arm, Patient Position: Sitting)   Pulse 65   Wt 192 lb (87.1 kg)   SpO2 98%   BMI 30.07 kg/m   Current Meds  Medication Sig  . clotrimazole-betamethasone (LOTRISONE) cream Apply topically.  Marland Kitchen ipratropium (ATROVENT) 0.06 % nasal spray ipratropium bromide 42 mcg (0.06 %) nasal spray  SPRAY 2 SPRAYS INTO BOTH NOSTRILS 4 (FOUR) TIMES DAILY.  Marland Kitchen lansoprazole (PREVACID) 30 MG capsule Take by mouth.  . venlafaxine XR (EFFEXOR-XR) 150 MG 24 hr capsule Take 150 mg by mouth daily.    No results found for this or any previous visit (from the past 72 hour(s)).  No results found.     All questions at time of visit were answered - patient instructed to contact office with any additional concerns or updates.  ER/RTC precautions were reviewed with the patient as applicable.   Please note: voice recognition software was used to produce this document, and typos may escape review. Please contact Dr. Sheppard Coil for any needed clarifications.   Total encounter time: 40 minutes.

## 2019-09-08 ENCOUNTER — Ambulatory Visit (INDEPENDENT_AMBULATORY_CARE_PROVIDER_SITE_OTHER): Payer: BC Managed Care – PPO | Admitting: Osteopathic Medicine

## 2019-09-08 ENCOUNTER — Encounter: Payer: Self-pay | Admitting: Osteopathic Medicine

## 2019-09-08 VITALS — BP 124/76 | HR 76 | Wt 190.0 lb

## 2019-09-08 DIAGNOSIS — K1370 Unspecified lesions of oral mucosa: Secondary | ICD-10-CM

## 2019-09-08 NOTE — Progress Notes (Signed)
Tammy Carrillo is a 64 y.o. female who presents to  [place] at [facility]  today, 09/08/19, seeking care for the following:  . Dentist sent patient here with concerns for oral lesion  . Of note, since this patient is unvaccinated, I donned appropriate N95 mask, face shield, and gown before going into the room to look at her mouth.  She asked if this was really necessary, I said that since she is unvaccinated I was wearing this to protect myself if she wanted me to look at her mouth.  She argued back, stating that she "cannot get Covid" to which I promptly responded "that is incorrect, you can absolutely get Covid."  She argued again with me, stating that she could not get Covid, I again said that that was not the case.  She said she did not appreciate my tone, I apologized if my tone was harsh, I have personally known people who have been very careful, have been vaccinated and wear masks religiously, and their children are now sick with COVID, so I am perhaps letting my personal experience affect my interactions in this conversation which was not professional of me and let's move on. Patient told me that "masks are not the way to prevent COVID" to which I responded "that is incorrect."  Patient again stated that she did not appreciate the way that I was speaking to her, I again apologized for my tone but not for my message or my opinions. I said that I would be wearing my PPE while in the room with her and if she wants me to look at her mouth lesion I can look at it.  I performed exam as noted above.  Patient again stated, she could not get Covid, she is "following Native American practices and they seem to have a better idea of what is going on."  I advised her that I strongly disagree with her, and all available science does not back up her claims that frankincense oil will prevent COVID infection. I said she is welcome to seek another physician if she  desires, but I will not change my stance on this issue.     ASSESSMENT & PLAN with other pertinent findings:  The encounter diagnosis was Oral lesion.   On exam this looks like a mild varicosity and does not appear to be any sort of malignant or premalignant growths. I advised her to monitor this and we can refer to oral surgeon if it seems worse or grows, or if she'd like a second opinion as I am certainly not an expert.       Follow-up instructions: Return if symptoms worsen or fail to improve.                                         BP 124/76 (BP Location: Left Arm, Patient Position: Sitting)   Pulse 76   Wt 190 lb (86.2 kg)   SpO2 96%   BMI 29.76 kg/m   Current Meds  Medication Sig  . clotrimazole-betamethasone (LOTRISONE) cream Apply topically.  . divalproex (DEPAKOTE ER) 500 MG 24 hr tablet Depakote 1000 mg po qhs x5 days then 500 mg po qhs x5 days  . ipratropium (ATROVENT) 0.06 % nasal spray ipratropium bromide 42 mcg (0.06 %) nasal spray  SPRAY 2 SPRAYS INTO BOTH NOSTRILS 4 (FOUR) TIMES DAILY.  Marland Kitchen  lansoprazole (PREVACID) 30 MG capsule Take by mouth.  . venlafaxine XR (EFFEXOR-XR) 150 MG 24 hr capsule Take 150 mg by mouth daily.    No results found for this or any previous visit (from the past 72 hour(s)).  No results found.     All questions at time of visit were answered - patient instructed to contact office with any additional concerns or updates.  ER/RTC precautions were reviewed with the patient as applicable.   Please note: voice recognition software was used to produce this document, and typos may escape review. Please contact Dr. Sheppard Coil for any needed clarifications.

## 2019-09-11 ENCOUNTER — Ambulatory Visit (INDEPENDENT_AMBULATORY_CARE_PROVIDER_SITE_OTHER): Payer: BC Managed Care – PPO

## 2019-09-11 ENCOUNTER — Other Ambulatory Visit: Payer: Self-pay

## 2019-09-11 DIAGNOSIS — G4459 Other complicated headache syndrome: Secondary | ICD-10-CM

## 2019-09-11 MED ORDER — GADOBUTROL 1 MMOL/ML IV SOLN
8.5000 mL | Freq: Once | INTRAVENOUS | Status: AC | PRN
  Administered 2019-09-11: 10 mL via INTRAVENOUS

## 2019-09-12 ENCOUNTER — Other Ambulatory Visit: Payer: Self-pay | Admitting: Osteopathic Medicine

## 2019-09-12 DIAGNOSIS — D329 Benign neoplasm of meninges, unspecified: Secondary | ICD-10-CM

## 2019-09-12 NOTE — Progress Notes (Signed)
See MRI results

## 2020-03-26 ENCOUNTER — Emergency Department (INDEPENDENT_AMBULATORY_CARE_PROVIDER_SITE_OTHER)
Admission: EM | Admit: 2020-03-26 | Discharge: 2020-03-26 | Disposition: A | Discharge status: Home / Self Care | Payer: 59 | Source: Home / Self Care

## 2020-03-26 ENCOUNTER — Other Ambulatory Visit: Payer: Self-pay

## 2020-03-26 ENCOUNTER — Emergency Department (INDEPENDENT_AMBULATORY_CARE_PROVIDER_SITE_OTHER): Payer: BC Managed Care – PPO

## 2020-03-26 DIAGNOSIS — J209 Acute bronchitis, unspecified: Secondary | ICD-10-CM

## 2020-03-26 DIAGNOSIS — R058 Other specified cough: Secondary | ICD-10-CM | POA: Diagnosis not present

## 2020-03-26 DIAGNOSIS — R093 Abnormal sputum: Secondary | ICD-10-CM | POA: Diagnosis not present

## 2020-03-26 MED ORDER — PROMETHAZINE-DM 6.25-15 MG/5ML PO SYRP
5.0000 mL | ORAL_SOLUTION | Freq: Four times a day (QID) | ORAL | 0 refills | Status: DC | PRN
Start: 2020-03-26 — End: 2021-06-12

## 2020-03-26 MED ORDER — ALBUTEROL SULFATE HFA 108 (90 BASE) MCG/ACT IN AERS
1.0000 | INHALATION_SPRAY | RESPIRATORY_TRACT | Status: DC
  Administered 2020-03-26: 2 via RESPIRATORY_TRACT

## 2020-03-26 MED ORDER — PREDNISONE 20 MG PO TABS
40.0000 mg | ORAL_TABLET | Freq: Every day | ORAL | 0 refills | Status: DC
Start: 2020-03-26 — End: 2021-06-12

## 2020-03-26 MED ORDER — DOXYCYCLINE HYCLATE 100 MG PO CAPS
100.0000 mg | ORAL_CAPSULE | Freq: Two times a day (BID) | ORAL | 0 refills | Status: AC
Start: 2020-03-26 — End: 2020-04-02

## 2020-03-26 NOTE — Discharge Instructions (Addendum)
Continue Albuterol inhaler 2 puffs every 4-6 hours. Start prednisone tomorrow . Start antibiotic tonight. PCP if symptoms worsen.

## 2020-03-26 NOTE — ED Provider Notes (Signed)
[facility]    CSN: [id number] Arrival date & time: 03/26/20  1620      History   Chief Complaint Chief Complaint  Patient presents with  . Fever  . Cough    HPI Tammy Carrillo is a 65 y.o. female.   HPI Patient presents with fever and cough x 2 days. Recently recovered from COVID x 1 month ago. Denies sick contacts. She endorses mild SOB, persistent wheezing, productive cough with an abrupt onset 2 days ago. She endorses fatigue. Non smoker, distant hx of smoking. She has taken OTC medication or symptoms without relief. Past Medical History:  Diagnosis Date  . Depression   . Migraines     Patient Active Problem List   Diagnosis Date Noted  . Nose congested 12/21/2018  . Dizziness 12/21/2018  . Palpitations 12/21/2018  . Migraine headache 09/02/2011  . Depression 03/13/2011    Past Surgical History:  Procedure Laterality Date  . CHOLECYSTECTOMY      OB History   No obstetric history on file.      Home Medications    Prior to Admission medications   Medication Sig Start Date End Date Taking? Authorizing Provider  clotrimazole-betamethasone (LOTRISONE) cream Apply topically. 08/23/19   [provider]  divalproex (DEPAKOTE ER) 500 MG 24 hr tablet Depakote 1000 mg po qhs x5 days then 500 mg po qhs x5 days 08/24/19   Emeterio Reeve, DO  ipratropium (ATROVENT) 0.06 % nasal spray ipratropium bromide 42 mcg (0.06 %) nasal spray  SPRAY 2 SPRAYS INTO BOTH NOSTRILS 4 (FOUR) TIMES DAILY.    [provider]  lansoprazole (PREVACID) 30 MG capsule Take by mouth. 02/24/18   [provider]  venlafaxine XR (EFFEXOR-XR) 150 MG 24 hr capsule Take 150 mg by mouth daily.    [provider]    Family History Family History  Problem Relation Age of Onset  . Cancer Father   . Stroke Father   . Diabetes Father   . Cancer Sister        breast  . COPD Mother   . Stroke Maternal Grandmother   . Heart attack Paternal  Grandmother     Social History Social History   Tobacco Use  . Smoking status: Former Smoker    Packs/day: 0.50    Years: 5.00    Pack years: 2.50    Types: Cigarettes    Quit date: 01/26/1985    Years since quitting: 35.1  . Smokeless tobacco: Never Used  Vaping Use  . Vaping Use: Never used  Substance Use Topics  . Alcohol use: No  . Drug use: No     Allergies   Patient has no known allergies.   Review of Systems Review of Systems Pertinent negatives listed in HPI   Physical Exam Triage Vital Signs ED Triage Vitals  Enc Vitals Group     BP 03/26/20 1634 127/84     Pulse Rate 03/26/20 1634 82     Resp 03/26/20 1634 16     Temp 03/26/20 1634 98.6 F (37 C)     Temp Source 03/26/20 1634 Oral     SpO2 03/26/20 1634 96 %     Weight --      Height --      Head Circumference --      Peak Flow --      Pain Score 03/26/20 1632 0     Pain Loc --      Pain Edu? --  Excl. in GC? --    No data found.  Updated Vital Signs BP 127/84 (BP Location: Right Arm)   Pulse 82   Temp 98.6 F (37 C) (Oral)   Resp 16   SpO2 96%   Visual Acuity Right Eye Distance:   Left Eye Distance:   Bilateral Distance:    Right Eye Near:   Left Eye Near:    Bilateral Near:     Physical Exam General appearance: alert, Ill-appearing, no distress Head: Normocephalic, without obvious abnormality, atraumatic ENT: External ears normal, nares patent with congestion, oropharynx w/o exudate  Heart: rate and rhythm normal. No gallop or murmurs noted on exam  Respiratory: Normal breathing pattern, diffuse wheezing, crackles bilateral mid posterior lungs Extremities: No gross deformities Skin: Skin color, texture, turgor normal. No rashes seen  Psych: Appropriate mood and affect. Neurologic: GCS 15, normal coordination, normal gait  UC Treatments / Results  Labs (all labs ordered are listed, but only abnormal results are displayed) Labs Reviewed - No data to  display  EKG   Radiology No results found.  Procedures Procedures (including critical care time)  Medications Ordered in UC Medications - No data to display  Initial Impression / Assessment and Plan / UC Course  I have reviewed the triage vital signs and the nursing notes.  Pertinent labs & imaging results that were available during my care of the patient were reviewed by me and considered in my medical decision making (see chart for details).     Given diffuse wheezing with bilateral mid lobe crackles concern for possible pneumonia however chest x-ray was negative.  Will cover for bronchitis and will add a oral antibiotic to cover for any underlying bacterial sources of current illness.  Patient has had fever for over the last 24 hours which has been controlled with Tylenol.  Albuterol inhaler along with chamber dispensed here in clinic patient tolerated 2 puffs.  Patient will start prednisone tomorrow as she did receive Decadron 10 mg IM while here in clinic.  Promethazine DM for cough management start antibiotics tonight.  Strict ER/return precautions if symptoms worsen or do not readily improve with treatment.  Patient verbalized understanding and agreement with plan. Final Clinical Impressions(s) / UC Diagnoses   Final diagnoses:  Acute bronchitis, unspecified organism     Discharge Instructions     Continue Albuterol inhaler 2 puffs every 4-6 hours. Start prednisone tomorrow . Start antibiotic tonight. PCP if symptoms worsen.    ED Prescriptions    Medication Sig Dispense Auth. Provider   predniSONE (DELTASONE) 20 MG tablet Take 2 tablets (40 mg total) by mouth daily with breakfast. 10 tablet Scot Jun, FNP   doxycycline (VIBRAMYCIN) 100 MG capsule Take 1 capsule (100 mg total) by mouth 2 (two) times daily for 7 days. 14 capsule Scot Jun, FNP   promethazine-dextromethorphan (PROMETHAZINE-DM) 6.25-15 MG/5ML syrup Take 5 mLs by mouth 4 (four) times  daily as needed for cough. 140 mL Scot Jun, FNP     PDMP not reviewed this encounter.   Scot Jun, [place] 03/30/20 [phone]

## 2020-03-26 NOTE — ED Triage Notes (Signed)
Patient presents to Urgent Care with complaints of low grade fever and productive cough since two days ago. Patient reports she thinks she has bronchitis; highest fever was 100.0 today.

## 2020-05-17 ENCOUNTER — Ambulatory Visit (INDEPENDENT_AMBULATORY_CARE_PROVIDER_SITE_OTHER): Payer: BC Managed Care – PPO | Admitting: Family Medicine

## 2020-05-17 ENCOUNTER — Other Ambulatory Visit: Payer: Self-pay

## 2020-05-17 ENCOUNTER — Encounter: Payer: Self-pay | Admitting: Family Medicine

## 2020-05-17 VITALS — BP 142/83 | HR 73 | Temp 98.5°F | Resp 17 | Wt 187.3 lb

## 2020-05-17 DIAGNOSIS — R002 Palpitations: Secondary | ICD-10-CM | POA: Diagnosis not present

## 2020-05-17 DIAGNOSIS — M79641 Pain in right hand: Secondary | ICD-10-CM | POA: Diagnosis not present

## 2020-05-17 DIAGNOSIS — R0789 Other chest pain: Secondary | ICD-10-CM

## 2020-05-17 NOTE — Progress Notes (Addendum)
Acute Office Visit  Subjective:    Patient ID: Tammy Carrillo, female    DOB: 01-19-1956, 65 y.o.   MRN: [id number]  Chief Complaint  Patient presents with  . Chest Pain  . Palpitations    HPI Patient is in today for palpitations.  Patient reports that ever since late January (after having COVID) she has been having some recurrent chest tightness and palpitations. She states episodes are characterized by 2-3 minutes of chest tightness and pressure with a few strong beats, followed by a few minutes of feeling lightheaded. She describes the pain as 2/10 sharpness that is followed by "fluttering and compression." Upon resolution, she then feels tired and will sometimes nap for a couple of hours. Episodes were occurring every day, sometimes multiple times per day. 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 She reports the episodes always occur when she is up moving around, cleaning the house, chasing kids around at school, etc - always with activity. She does get dyspneic and nauseous with episodes, but does not report any radiation.  Last night, she took a 2 mile walk (up and down hills) with her husband and did not have any symptoms at all. 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 She reports minimal caffeine intake -one cup of coffee per day but diluted with 70% decaf/30% caf. She does try to eat healthy and drink plenty of water.  Patient also reporting swelling/cyst to right palm that is painful. States it has been there for quite awhile now, but seems to be getting a little larger. No erythema, surrounding edema, drainage.   Past Medical History:  Diagnosis Date  . Depression   . Migraines     Past Surgical History:  Procedure Laterality Date  . CHOLECYSTECTOMY      Family History  Problem Relation Age of Onset  . Cancer Father   .  Stroke Father   . Diabetes Father   . Cancer Sister        breast  . COPD Mother   . Stroke Maternal Grandmother   . Heart attack Paternal Grandmother     Social History   Socioeconomic History  . Marital status: Married    Spouse name: Not on file  . Number of children: Not on file  . Years of education: Not on file  . Highest education level: Not on file  Occupational History  . Occupation: Pharmacist, hospital  Tobacco Use  . Smoking status: Former Smoker    Packs/day: 0.50    Years: 5.00    Pack years: 2.50    Types: Cigarettes    Quit date: 01/26/1985    Years since quitting: 35.3  . Smokeless tobacco: Never Used  Vaping Use  . Vaping Use: Never used  Substance and Sexual Activity  . Alcohol use: No  . Drug use: No  . Sexual activity: Yes    Partners: Male    Birth control/protection: None  Other Topics Concern  . Not on file  Social History Narrative  . Not on file   Social Determinants of Health   Financial Resource Strain: Not on file  Food Insecurity: Not on file  Transportation Needs: Not on file  Physical Activity: Not on file  Stress: Not on file  Social Connections: Not on file  Intimate Partner Violence: Not on file  Outpatient Medications Prior to Visit  Medication Sig Dispense Refill  . clotrimazole-betamethasone (LOTRISONE) cream Apply topically.    . divalproex (DEPAKOTE ER) 500 MG 24 hr tablet Depakote 1000 mg po qhs x5 days then 500 mg po qhs x5 days 15 tablet 0  . ipratropium (ATROVENT) 0.06 % nasal spray ipratropium bromide 42 mcg (0.06 %) nasal spray  SPRAY 2 SPRAYS INTO BOTH NOSTRILS 4 (FOUR) TIMES DAILY.    Marland Kitchen lansoprazole (PREVACID) 30 MG capsule Take by mouth.    . predniSONE (DELTASONE) 20 MG tablet Take 2 tablets (40 mg total) by mouth daily with breakfast. 10 tablet 0  . promethazine-dextromethorphan (PROMETHAZINE-DM) 6.25-15 MG/5ML syrup Take 5 mLs by mouth 4 (four) times daily as needed for cough. 140 mL 0  . venlafaxine XR (EFFEXOR-XR)  150 MG 24 hr capsule Take 150 mg by mouth daily.     No facility-administered medications prior to visit.    No Known Allergies  Review of Systems All review of systems negative except what is listed in the HPI No symptoms at time of visit.    Objective:    Physical Exam Vitals reviewed.  Constitutional:      Appearance: She is well-developed.  HENT:     Head: Normocephalic.  Neck:     Thyroid: No thyroid mass, thyromegaly or thyroid tenderness.  Cardiovascular:     Rate and Rhythm: Normal rate and regular rhythm.     Heart sounds: Normal heart sounds.  Pulmonary:     Effort: Pulmonary effort is normal.     Breath sounds: Normal breath sounds.  Chest:     Chest wall: No mass, tenderness or crepitus.  Musculoskeletal:        General: Normal range of motion.       Hands:     Cervical back: Full passive range of motion without pain.  Lymphadenopathy:     Cervical: No cervical adenopathy.  Skin:    General: Skin is warm and dry.     Capillary Refill: Capillary refill takes less than 2 seconds.  Neurological:     General: No focal deficit present.     Mental Status: She is alert and oriented to person, place, and time.  Psychiatric:        Mood and Affect: Mood normal. Mood is not anxious.        Behavior: Behavior normal.     BP (!) 142/83   Pulse 73   Temp 98.5 F (36.9 C)   Resp 17   Wt 187 lb 4.8 oz (85 kg)   SpO2 97%   BMI 29.34 kg/m  Wt Readings from Last 3 Encounters:  05/17/20 187 lb 4.8 oz (85 kg)  09/08/19 190 lb (86.2 kg)  08/24/19 192 lb (87.1 kg)    Health Maintenance Due  Topic Date Due  . COVID-19 Vaccine (1) Never done  . COLONOSCOPY (Pts 45-51yrs Insurance coverage will need to be confirmed)  Never done  . MAMMOGRAM  Never done    There are no preventive care reminders to display for this patient.   Lab Results  Component Value Date   TSH 0.51 03/09/2018   Lab Results  Component Value Date   WBC 8.4 03/09/2018   HGB 14.0  03/09/2018   HCT 40.6 03/09/2018   MCV 90.6 03/09/2018   PLT 306 03/09/2018   Lab Results  Component Value Date   NA 142 03/09/2018   K 4.3 03/09/2018   CO2 26 03/09/2018  GLUCOSE 81 03/09/2018   BUN 15 03/09/2018   CREATININE 0.78 03/09/2018   BILITOT 0.3 03/09/2018   AST 15 03/09/2018   ALT 10 03/09/2018   PROT 6.3 03/09/2018   CALCIUM 9.5 03/09/2018   ANIONGAP 10 07/05/2014   No results found for: CHOL No results found for: HDL No results found for: LDLCALC No results found for: TRIG No results found for: CHOLHDL No results found for: HGBA1C     Assessment & Plan:   1. Palpitations 2. Chest tightness Patient with chest tightness/pressure and palpitations episodes that have been recurring with activity for the past several weeks, though slight decrease in frequency this week while on spring break. No symptoms in office today. Normal exam, no acute distress or red flags. It's been over 2 years since she's had any labs drawn, so we will check several today in including CBC, CMP, TSH, and cardiac enzymes. EKG in office was NSR with sinus arrhythmia with possible PAC aberrant conduction. We will go ahead and do a home cardiac monitor for 14 days. She deferred echo at this time. Will consider echo and/or cardiology referral pending results. Patient educated on signs and symptoms requiring urgent evaluation. Follow-up pending results or sooner if needed.   - CBC with Differential - COMPLETE METABOLIC PANEL WITH GFR - TSH - Troponin I - CK Total (and CKMB) - LONG TERM MONITOR (3-14 DAYS); Future  3. Hand pain, right Possible ganglion cyst vs dupuytren's contracture. Recommend starting with conservative measures including NSAIDs, rest, stretching. She prefers to use OTC NSAIDs for now. If no improvement in 4-6 weeks, see Dr. Darene Lamer for further evaluation and management.    Follow-up if symptoms worsen or fail to improve.   Terrilyn Saver, NP

## 2020-05-18 LAB — CBC WITH DIFFERENTIAL/PLATELET
Absolute Monocytes: 478 cells/uL (ref 200–950)
Basophils Absolute: 30 cells/uL (ref 0–200)
Basophils Relative: 0.5 %
Eosinophils Absolute: 460 cells/uL (ref 15–500)
Eosinophils Relative: 7.8 %
HCT: 45.2 % — ABNORMAL HIGH (ref 35.0–45.0)
Hemoglobin: 15.2 g/dL (ref 11.7–15.5)
Lymphs Abs: 1428 cells/uL (ref 850–3900)
MCH: 30.6 pg (ref 27.0–33.0)
MCHC: 33.6 g/dL (ref 32.0–36.0)
MCV: 91.1 fL (ref 80.0–100.0)
MPV: 11.3 fL (ref 7.5–12.5)
Monocytes Relative: 8.1 %
Neutro Abs: 3505 cells/uL (ref 1500–7800)
Neutrophils Relative %: 59.4 %
Platelets: 268 10*3/uL (ref 140–400)
RBC: 4.96 10*6/uL (ref 3.80–5.10)
RDW: 12.4 % (ref 11.0–15.0)
Total Lymphocyte: 24.2 %
WBC: 5.9 10*3/uL (ref 3.8–10.8)

## 2020-05-18 LAB — CK TOTAL AND CKMB (NOT AT ARMC)
CK, MB: 0.9 ng/mL (ref 0–5.0)
Relative Index: 1.6 (ref 0–4.0)
Total CK: 58 U/L (ref 29–143)

## 2020-05-18 LAB — COMPLETE METABOLIC PANEL WITH GFR
AG Ratio: 1.8 (calc) (ref 1.0–2.5)
ALT: 11 U/L (ref 6–29)
AST: 16 U/L (ref 10–35)
Albumin: 4.2 g/dL (ref 3.6–5.1)
Alkaline phosphatase (APISO): 65 U/L (ref 37–153)
BUN: 16 mg/dL (ref 7–25)
CO2: 24 mmol/L (ref 20–32)
Calcium: 9.2 mg/dL (ref 8.6–10.4)
Chloride: 104 mmol/L (ref 98–110)
Creat: 0.66 mg/dL (ref 0.50–0.99)
GFR, Est African American: 108 mL/min/{1.73_m2} (ref >=60)
GFR, Est Non African American: 93 mL/min/{1.73_m2} (ref >=60)
Globulin: 2.3 g/dL (calc) (ref 1.9–3.7)
Glucose, Bld: 92 mg/dL (ref 65–99)
Potassium: 3.8 mmol/L (ref 3.5–5.3)
Sodium: 140 mmol/L (ref 135–146)
Total Bilirubin: 0.3 mg/dL (ref 0.2–1.2)
Total Protein: 6.5 g/dL (ref 6.1–8.1)

## 2020-05-18 LAB — TSH: TSH: 1.03 mIU/L (ref 0.40–4.50)

## 2020-05-18 LAB — TROPONIN I: Troponin I: 4 ng/L (ref <=47)

## 2020-05-20 ENCOUNTER — Encounter: Payer: Self-pay | Admitting: *Deleted

## 2020-05-20 ENCOUNTER — Ambulatory Visit (INDEPENDENT_AMBULATORY_CARE_PROVIDER_SITE_OTHER): Payer: BC Managed Care – PPO

## 2020-05-20 DIAGNOSIS — R0789 Other chest pain: Secondary | ICD-10-CM

## 2020-05-20 DIAGNOSIS — R002 Palpitations: Secondary | ICD-10-CM

## 2020-05-20 NOTE — Addendum Note (Signed)
Addended by: Narda Rutherford on: 05/20/2020 10:28 AM   Modules accepted: Orders

## 2020-05-23 DIAGNOSIS — R002 Palpitations: Secondary | ICD-10-CM

## 2020-05-23 DIAGNOSIS — R0789 Other chest pain: Secondary | ICD-10-CM | POA: Diagnosis not present

## 2020-06-27 ENCOUNTER — Ambulatory Visit (INDEPENDENT_AMBULATORY_CARE_PROVIDER_SITE_OTHER): Payer: BC Managed Care – PPO | Admitting: Osteopathic Medicine

## 2020-06-27 ENCOUNTER — Other Ambulatory Visit: Payer: Self-pay

## 2020-06-27 ENCOUNTER — Encounter: Payer: Self-pay | Admitting: Osteopathic Medicine

## 2020-06-27 VITALS — BP 126/83 | HR 74 | Temp 98.4°F | Wt 188.1 lb

## 2020-06-27 DIAGNOSIS — I471 Supraventricular tachycardia: Secondary | ICD-10-CM

## 2020-06-27 NOTE — Progress Notes (Signed)
Tammy Carrillo is a 65 y.o. female who presents to  [place] at [facility]  today, 06/27/20, seeking care for the following:  . Heart monitor results - saw one of or NP's for palpitations 05/17/20, episodes recurrent w/ activity x2 weeks. Zio monitor (+)SVT. Pt declined echo.       ASSESSMENT & PLAN with other pertinent findings:  The encounter diagnosis was Paroxysmal supraventricular tachycardia ([place]).   No sustained SVT SVT beats correspond to symptoms as per Zio trigger notes Education, reassurance provided Option for beta blocker +- cardiology referral Educated on ER precautions Educated on valsalva maneuvers if sustained symptoms  Pt states since cutting back caffeine / sugar, episodes less frequent Al questions answered Pt ok to defer meds and cardiology referral for now, I think this is reasonable but would have low threshold for referral / Rx  Patient Instructions   Supraventricular Tachycardia, Adult Supraventricular tachycardia (SVT) is a kind of abnormal heartbeat. It makes your heart beat very fast. This may last for a short time and then return to normal, or it may last longer. A normal resting heartbeat is 60-100 times a minute. This condition can make your heart beat more than 150 times a minute. Times of having a fast heartbeat (episodes) can be scary, but they are usually not dangerous. In some cases, they may lead to heart failure if they:  Happen many times a day.  Last longer than a few seconds. What are the causes? This condition happens when electrical signals are sent out from areas of the heart that do not normally send signals for the heartbeat.   What increases the risk? You are more likely to develop this condition if you are:  Middle aged or younger.  Female. The following factors may also make you more likely to develop this condition:  Stress.  Feeling worried or nervous  (anxiety).  Tiredness.  Smoking.  Stimulant drugs, such as cocaine and methamphetamine.  Alcohol.  Caffeine.  Pregnancy.  Having certain medical conditions. What are the signs or symptoms?  A pounding heart.  A feeling that your heart is skipping beats (palpitations).  Weakness.  Trouble getting enough air.  Pain or tightness in your chest.  Dizziness or feeling like you are going to pass out (faint).  Feeling worried or nervous.  Sweating.  Feeling like you may vomit (nausea).  Passing out.  Tiredness. Sometimes, there are no symptoms. How is this treated? Treatment may include:  Vagal nerve stimulation. Ways to do this include: ? Holding your breath and pushing, as though you are pooping (having a bowel movement). ? Massaging an area on one side of your neck. Do not try this yourself. Only a doctor should do this. If done the wrong way, it can lead to a stroke. ? Bending forward with your head between your legs. ? Coughing while bending forward with your head between your legs. ? Putting an ice-cold, wet towel on your face.  Medicines that prevent attacks.  Medicine to stop an attack given through an IV tube at the hospital.  A small electric shock (cardioversion) that stops an attack.  A procedure to get rid of cells in the area that is causing the fast heartbeats (radiofrequency ablation). If you do not have symptoms, you may not need treatment. Follow these instructions at home: Stress  Avoid things that make you feel stressed.  To deal with stress, try: ? Doing yoga or meditation. ? Being  out in nature. ? Listening to relaxing music. ? Doing deep breathing. ? Taking steps to be healthy, such as getting lots of sleep, exercising, and eating a balanced diet. ? Talking with a mental health doctor. Lifestyle  Try to get at least 7 hours of sleep each night.  Do not smoke or use any products that contain nicotine or tobacco. If you need help  quitting, ask your doctor.  Do not drink alcohol if it gives you a fast heartbeat.  If alcohol does not seem to give you a fast heartbeat, limit your alcohol use. If you drink alcohol: ? Limit how much you have to:  0-1 drink a day for women who are not pregnant.  0-2 drinks a day for men. ? Know how much alcohol is in your drink. In the [place] one drink equals one 12 oz bottle of beer (355 mL), one 5 oz glass of wine (148 mL), or one 1 oz glass of hard liquor (44 mL).  Be aware of how caffeine affects you. ? If caffeine gives you a fast heartbeat, do not eat, drink, or use anything with caffeine in it. ? If caffeine does not seem to give you a fast heartbeat, limit how much caffeine you eat, drink, or use.  Do not use stimulant drugs. If you need help quitting, ask your doctor.   General instructions  Stay at a healthy weight.  Exercise regularly. Ask your doctor about good activities for you. Try one or a mixture of these: ? 150 minutes a week of gentle exercise, like walking or yoga. ? 75 minutes a week of exercise that is very active, like running or swimming.  Do vagus nerve treatments to slow down your heartbeat as told by your doctor.  Take over-the-counter and prescription medicines only as told by your doctor.  Keep all follow-up visits. Contact a doctor if:  You have a fast heartbeat more often.  Times of having a fast heartbeat last longer than before.  Home treatments to slow down your heartbeat do not help.  You have new symptoms. Get help right away if:  You have chest pain.  Your symptoms get worse.  You have trouble breathing.  Your heart beats very fast for more than 20 minutes.  You pass out. These symptoms may be an emergency. Get medical help right away. Call your local emergency services (911 in the [place]  Do not wait to see if the symptoms will go away.  Do not drive yourself to the hospital. Summary  SVT is a type of abnormal  heartbeat.  This condition can make your heart beat more than 150 times a minute.  If you do not have symptoms, you may not need treatment. This information is not intended to replace advice given to you by your health care provider. Make sure you discuss any questions you have with your health care provider. Document Revised: 08/26/2019 Document Reviewed: 08/26/2019 Elsevier Patient Education  [place].    No orders of the defined types were placed in this encounter.   No orders of the defined types were placed in this encounter.    See below for relevant physical exam findings  See below for recent lab and imaging results reviewed  Medications, allergies, PMH, PSH, SocH, FamH reviewed below    Follow-up instructions: No follow-ups on file.  Exam:  BP 126/83 (BP Location: Left Arm, Patient Position: Sitting, Cuff Size: Large)   Pulse 74   Temp 98.4 F (36.9 C) (Oral)   Wt 188 lb 1.9 oz (85.3 kg)   BMI 29.46 kg/m   Constitutional: VS see above. General Appearance: alert, well-developed, well-nourished, NAD  Neck: No masses, trachea midline.   Respiratory: Normal respiratory effort. no wheeze, no rhonchi, no rales  Cardiovascular: S1/S2 normal, no murmur, no rub/gallop auscultated. RRR.   Neurological: Normal balance/coordination. No tremor.  Skin: warm, dry, intact.   Psychiatric: Normal judgment/insight. Normal mood and affect. Oriented x3.   Current Meds  Medication Sig  . lansoprazole (PREVACID) 30 MG capsule Take by mouth.  . venlafaxine XR (EFFEXOR-XR) 150 MG 24 hr capsule Take 150 mg by mouth daily.    No Known Allergies  Patient Active Problem List   Diagnosis Date Noted  . Nose congested 12/21/2018  . Dizziness 12/21/2018  . Palpitations 12/21/2018  . Migraine headache 09/02/2011  . Depression 03/13/2011    Family History  Problem Relation Age of Onset  .  Cancer Father   . Stroke Father   . Diabetes Father   . Cancer Sister        breast  . COPD Mother   . Stroke Maternal Grandmother   . Heart attack Paternal Grandmother     Social History   Tobacco Use  Smoking Status Former Smoker  . Packs/day: 0.50  . Years: 5.00  . Pack years: 2.50  . Types: Cigarettes  . Quit date: 01/26/1985  . Years since quitting: 35.4  Smokeless Tobacco Never Used    Past Surgical History:  Procedure Laterality Date  . CHOLECYSTECTOMY      Immunization History  Administered Date(s) Administered  . Hepatitis B, ped/adol 02/27/2014  . MMR 02/27/2014  . PPD Test 02/27/2014  . Tdap 01/26/2002, 02/27/2014    Recent Results (from the past 2160 hour(s))  CBC with Differential     Status: Abnormal   Collection Time: 05/17/20 12:00 AM  Result Value Ref Range   WBC 5.9 3.8 - 10.8 Thousand/uL   RBC 4.96 3.80 - 5.10 Million/uL   Hemoglobin 15.2 11.7 - 15.5 g/dL   HCT 45.2 (H) 35.0 - 45.0 %   MCV 91.1 80.0 - 100.0 fL   MCH 30.6 27.0 - 33.0 pg   MCHC 33.6 32.0 - 36.0 g/dL   RDW 12.4 11.0 - 15.0 %   Platelets 268 140 - 400 Thousand/uL   MPV 11.3 7.5 - 12.5 fL   Neutro Abs 3,505 1,500 - 7,800 cells/uL   Lymphs Abs 1,428 850 - 3,900 cells/uL   Absolute Monocytes 478 200 - 950 cells/uL   Eosinophils Absolute 460 15 - 500 cells/uL   Basophils Absolute 30 0 - 200 cells/uL   Neutrophils Relative % 59.4 %   Total Lymphocyte 24.2 %   Monocytes Relative 8.1 %   Eosinophils Relative 7.8 %   Basophils Relative 0.5 %  COMPLETE METABOLIC PANEL WITH GFR     Status: None   Collection Time: 05/17/20 12:00 AM  Result Value Ref Range   Glucose, Bld 92 65 - 99 mg/dL    Comment: .            Fasting reference interval .    BUN 16 7 - 25 mg/dL   Creat 0.66 0.50 - 0.99 mg/dL    Comment: For patients >68 years of age, the reference limit for Creatinine is approximately 13% higher  for people identified as African-American. .    GFR, Est Non African American  93 > OR = 60 mL/min/1.39m   GFR, Est African American 108 > OR = 60 mL/min/1.772m  BUN/Creatinine Ratio NOT APPLICABLE 6 - 22 (calc)   Sodium 140 135 - 146 mmol/L   Potassium 3.8 3.5 - 5.3 mmol/L   Chloride 104 98 - 110 mmol/L   CO2 24 20 - 32 mmol/L   Calcium 9.2 8.6 - 10.4 mg/dL   Total Protein 6.5 6.1 - 8.1 g/dL   Albumin 4.2 3.6 - 5.1 g/dL   Globulin 2.3 1.9 - 3.7 g/dL (calc)   AG Ratio 1.8 1.0 - 2.5 (calc)   Total Bilirubin 0.3 0.2 - 1.2 mg/dL   Alkaline phosphatase (APISO) 65 37 - 153 U/L   AST 16 10 - 35 U/L   ALT 11 6 - 29 U/L  TSH     Status: None   Collection Time: 05/17/20 12:00 AM  Result Value Ref Range   TSH 1.03 0.40 - 4.50 mIU/L  Troponin I     Status: None   Collection Time: 05/17/20 12:00 AM  Result Value Ref Range   Troponin I 4 < OR = 47 ng/L    Comment: . In accord with published recommendations, serial testing of troponin I at intervals of 2 to 4 hours for up to 12 to 24 hours is suggested in order to corroborate a single troponin I result. An elevated troponin alone is not sufficient to make the diagnosis of MI. .   CK Total (and CKMB)     Status: None   Collection Time: 05/17/20 12:00 AM  Result Value Ref Range   Total CK 58 29 - 143 U/L   CK, MB 0.9 0 - 5.0 ng/mL   Relative Index 1.6 0 - 4.0    No results found.     All questions at time of visit were answered - patient instructed to contact office with any additional concerns or updates. ER/RTC precautions were reviewed with the patient as applicable.   Please note: manual typing as well as voice recognition software may have been used to produce this document - typos may escape review. Please contact Dr. AlSheppard Coilor any needed clarifications.   Total encounter time on date of service, 06/28/20, was 30 minutes spent addressing problems[place]issues as noted [place] in discussion with patient regarding the HPI, ROS, confirming history, reviewing  Assessment & Plan, as well as time spent on coordination of care, record review.

## 2020-06-27 NOTE — Patient Instructions (Signed)
Supraventricular Tachycardia, Adult Supraventricular tachycardia (SVT) is a kind of abnormal heartbeat. It makes your heart beat very fast. This may last for a short time and then return to normal, or it may last longer. A normal resting heartbeat is 60-100 times a minute. This condition can make your heart beat more than 150 times a minute. Times of having a fast heartbeat (episodes) can be scary, but they are usually not dangerous. In some cases, they may lead to heart failure if they:  Happen many times a day.  Last longer than a few seconds. What are the causes? This condition happens when electrical signals are sent out from areas of the heart that do not normally send signals for the heartbeat.   What increases the risk? You are more likely to develop this condition if you are:  Middle aged or younger.  Female. The following factors may also make you more likely to develop this condition:  Stress.  Feeling worried or nervous (anxiety).  Tiredness.  Smoking.  Stimulant drugs, such as cocaine and methamphetamine.  Alcohol.  Caffeine.  Pregnancy.  Having certain medical conditions. What are the signs or symptoms?  A pounding heart.  A feeling that your heart is skipping beats (palpitations).  Weakness.  Trouble getting enough air.  Pain or tightness in your chest.  Dizziness or feeling like you are going to pass out (faint).  Feeling worried or nervous.  Sweating.  Feeling like you may vomit (nausea).  Passing out.  Tiredness. Sometimes, there are no symptoms. How is this treated? Treatment may include:  Vagal nerve stimulation. Ways to do this include: ? Holding your breath and pushing, as though you are pooping (having a bowel movement). ? Massaging an area on one side of your neck. Do not try this yourself. Only a doctor should do this. If done the wrong way, it can lead to a stroke. ? Bending forward with your head between your legs. ? Coughing  while bending forward with your head between your legs. ? Putting an ice-cold, wet towel on your face.  Medicines that prevent attacks.  Medicine to stop an attack given through an IV tube at the hospital.  A small electric shock (cardioversion) that stops an attack.  A procedure to get rid of cells in the area that is causing the fast heartbeats (radiofrequency ablation). If you do not have symptoms, you may not need treatment. Follow these instructions at home: Stress  Avoid things that make you feel stressed.  To deal with stress, try: ? Doing yoga or meditation. ? Being out in nature. ? Listening to relaxing music. ? Doing deep breathing. ? Taking steps to be healthy, such as getting lots of sleep, exercising, and eating a balanced diet. ? Talking with a mental health doctor. Lifestyle  Try to get at least 7 hours of sleep each night.  Do not smoke or use any products that contain nicotine or tobacco. If you need help quitting, ask your doctor.  Do not drink alcohol if it gives you a fast heartbeat.  If alcohol does not seem to give you a fast heartbeat, limit your alcohol use. If you drink alcohol: ? Limit how much you have to:  0-1 drink a day for women who are not pregnant.  0-2 drinks a day for men. ? Know how much alcohol is in your drink. In the [place] one drink equals one 12 oz bottle of beer (355 mL), one 5 oz glass of wine (148   mL), or one 1 oz glass of hard liquor (44 mL).  Be aware of how caffeine affects you. ? If caffeine gives you a fast heartbeat, do not eat, drink, or use anything with caffeine in it. ? If caffeine does not seem to give you a fast heartbeat, limit how much caffeine you eat, drink, or use.  Do not use stimulant drugs. If you need help quitting, ask your doctor.   General instructions  Stay at a healthy weight.  Exercise regularly. Ask your doctor about good activities for you. Try one or a mixture of these: ? 150 minutes a week of  gentle exercise, like walking or yoga. ? 75 minutes a week of exercise that is very active, like running or swimming.  Do vagus nerve treatments to slow down your heartbeat as told by your doctor.  Take over-the-counter and prescription medicines only as told by your doctor.  Keep all follow-up visits. Contact a doctor if:  You have a fast heartbeat more often.  Times of having a fast heartbeat last longer than before.  Home treatments to slow down your heartbeat do not help.  You have new symptoms. Get help right away if:  You have chest pain.  Your symptoms get worse.  You have trouble breathing.  Your heart beats very fast for more than 20 minutes.  You pass out. These symptoms may be an emergency. Get medical help right away. Call your local emergency services (911 in the [place]  Do not wait to see if the symptoms will go away.  Do not drive yourself to the hospital. Summary  SVT is a type of abnormal heartbeat.  This condition can make your heart beat more than 150 times a minute.  If you do not have symptoms, you may not need treatment. This information is not intended to replace advice given to you by your health care provider. Make sure you discuss any questions you have with your health care provider. Document Revised: 08/26/2019 Document Reviewed: 08/26/2019 Elsevier Patient Education  2021 Elsevier Inc.  

## 2020-09-18 ENCOUNTER — Telehealth: Payer: Self-pay | Admitting: General Practice

## 2020-09-18 NOTE — Telephone Encounter (Signed)
Transition Care Management Unsuccessful Follow-up Telephone Call  Date of discharge and from where:  09/17/20 from [facility]  Attempts:  1st Attempt  Reason for unsuccessful TCM follow-up call:  Left voice message

## 2020-09-20 NOTE — Telephone Encounter (Signed)
Transition Care Management Unsuccessful Follow-up Telephone Call  Date of discharge and from where:  09/17/20 from [facility]  Attempts:  2nd Attempt  Reason for unsuccessful TCM follow-up call:  Left voice message

## 2020-09-23 NOTE — Telephone Encounter (Signed)
Transition Care Management Unsuccessful Follow-up Telephone Call  Date of discharge and from where:  09/17/20 from [facility]  Attempts:  3rd Attempt  Reason for unsuccessful TCM follow-up call:  Left voice message

## 2020-10-14 ENCOUNTER — Telehealth: Payer: Self-pay | Admitting: Osteopathic Medicine

## 2020-10-14 DIAGNOSIS — R519 Headache, unspecified: Secondary | ICD-10-CM

## 2020-10-14 NOTE — Telephone Encounter (Signed)
Refer neurology for headaches - pt's husband was in for his appt today and asked about referral - I did instruct him she should come in and be seen if there is concern but I will get ball rolling on the referral

## 2020-10-16 ENCOUNTER — Ambulatory Visit: Payer: BC Managed Care – PPO | Admitting: Osteopathic Medicine

## 2020-11-19 ENCOUNTER — Ambulatory Visit: Payer: BC Managed Care – PPO | Admitting: Family Medicine

## 2020-12-05 DIAGNOSIS — I493 Ventricular premature depolarization: Secondary | ICD-10-CM | POA: Insufficient documentation

## 2021-05-21 LAB — HM MAMMOGRAPHY

## 2021-06-12 ENCOUNTER — Ambulatory Visit (INDEPENDENT_AMBULATORY_CARE_PROVIDER_SITE_OTHER): Payer: BC Managed Care – PPO | Admitting: Physician Assistant

## 2021-06-12 ENCOUNTER — Encounter: Payer: Self-pay | Admitting: Physician Assistant

## 2021-06-12 VITALS — BP 149/80 | HR 76 | Ht 67.0 in | Wt 184.0 lb

## 2021-06-12 DIAGNOSIS — M549 Dorsalgia, unspecified: Secondary | ICD-10-CM

## 2021-06-12 DIAGNOSIS — N62 Hypertrophy of breast: Secondary | ICD-10-CM

## 2021-06-12 DIAGNOSIS — G44229 Chronic tension-type headache, not intractable: Secondary | ICD-10-CM

## 2021-06-12 DIAGNOSIS — Z79899 Other long term (current) drug therapy: Secondary | ICD-10-CM | POA: Diagnosis not present

## 2021-06-12 MED ORDER — NURTEC 75 MG PO TBDP: 1.0000 | ORAL_TABLET | ORAL | 1 refills | Status: DC

## 2021-06-12 NOTE — Patient Instructions (Addendum)
..  Keep headache diary and bring to your next appointment  Lifestyle changes to improve headache frequency:   Regular sleep routine -- go to bed at the same time and get up at the same time-- everyday, even on the weekends Regular exercise-40 minutes 3-4 times weekly (150 minutes weekly) Maintaining a healthy weight--portion control Avoid caffeine. Decaf tea or coffee is ok. (limit to150 mg daily) Avoid soda (caffeine or non)  Avoid artificial sweetners Avoid alcohol Avoid smoking/secondhand smoke Avoid ibuprofen, acetaminophen, naproxen, pseudoephedrine and other OTC analgesics Avoid opioids, narcotics and prescribed pain medication  Rebound Headaches/Medication overuse headache -- Overusing rescue medications more than twice weekly will worsen headache frequency and intensity over time. Will improve as you wean over the counter medications, ie-- Ibuprofen/ Tylenol/Excedrin, etc. and caffeine- Headaches may get worse before getting better as you withdraw from medication overuse and/or caffeine   Start Magnesium 400-'500mg'$  twice daily Start Vitamin B2 100-'200mg'$  twice daily OR Migrelief (on [employer])

## 2021-06-12 NOTE — Progress Notes (Signed)
   Acute Office Visit  Subjective:     Patient ID: Tammy Carrillo, female    DOB: 04-21-1955, 66 y.o.   MRN: [id number]  Chief Complaint  Patient presents with  . Headache    HPI Patient is in today for daily chronic headaches. She is taking 4 excedrin at a time multiple times a day. She is concerned with this intake. She denies any epigastric or reflux symptoms. She denies any melena or hematochezia. Excedrin does help. She finds herself almost "wanting" to take it to give her a "pep in her step". She does not currently have a headache. They seem to start in her neck and radiate into forehead and behind eyes. She has large breast with ongoing upper back and neck pain and tenderness. 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     .. Active Ambulatory Problems    Diagnosis Date Noted  . Depression 03/13/2011  . Migraine headache 09/02/2011  . Nose congested 12/21/2018  . Dizziness 12/21/2018  . Palpitations 12/21/2018  . 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 Chronic tension-type headache, not intractable 06/12/2021  . Large breasts 06/16/2021  . Upper back pain 06/16/2021   Resolved Ambulatory Problems    Diagnosis Date Noted  . No Resolved Ambulatory Problems   Past Medical History:  Diagnosis Date  . Migraines      ROS See HPI.      Objective:    BP (!) 149/80   Pulse 76   Ht '5\' 7"'$  (1.702 m)   Wt 184 lb (83.5 kg)   SpO2 99%   BMI 28.82 kg/m  BP Readings from Last 3 Encounters:  06/12/21 (!) 149/80  06/27/20 126/83  05/17/20 (!) 142/83   Wt Readings from Last 3 Encounters:  06/12/21 184 lb (83.5 kg)  06/27/20 188 lb 1.9 oz (85.3 kg)  05/17/20 187 lb 4.8 oz (85 kg)      Physical Exam Vitals reviewed.  Constitutional:      Appearance: She is well-developed. She is obese.  HENT:     Head: Normocephalic.  Neck:     Comments: NROM with neck tightness Cardiovascular:     Rate and Rhythm: Normal rate and regular  rhythm.     Heart sounds: Murmur heard.  Pulmonary:     Effort: Pulmonary effort is normal.     Breath sounds: Normal breath sounds.  Neurological:     Mental Status: She is alert and oriented to person, place, and time.  Psychiatric:        Mood and Affect: Mood normal.          Assessment & Plan:  Marland KitchenMarland KitchenDorthula was seen today for headache.  Diagnoses and all orders for this visit:  Chronic tension-type headache, not intractable -     Rimegepant Sulfate (NURTEC) 75 MG TBDP; Take 1 tablet by mouth every other day. -     COMPLETE METABOLIC PANEL WITH GFR  Medication management -     COMPLETE METABOLIC PANEL WITH GFR  Large breasts  Upper back pain   No HA    Return in about 4 weeks (around 07/10/2021), or if symptoms worsen or fail to improve, for migraines.  Iran Planas, PA-C

## 2021-06-13 LAB — COMPLETE METABOLIC PANEL WITH GFR
AG Ratio: 1.5 (calc) (ref 1.0–2.5)
ALT: 11 U/L (ref 6–29)
AST: 15 U/L (ref 10–35)
Albumin: 4 g/dL (ref 3.6–5.1)
Alkaline phosphatase (APISO): 66 U/L (ref 37–153)
BUN: 16 mg/dL (ref 7–25)
CO2: 31 mmol/L (ref 20–32)
Calcium: 9.4 mg/dL (ref 8.6–10.4)
Chloride: 105 mmol/L (ref 98–110)
Creat: 0.64 mg/dL (ref 0.50–1.05)
Globulin: 2.6 g/dL (calc) (ref 1.9–3.7)
Glucose, Bld: 90 mg/dL (ref 65–99)
Potassium: 4.7 mmol/L (ref 3.5–5.3)
Sodium: 143 mmol/L (ref 135–146)
Total Bilirubin: 0.3 mg/dL (ref 0.2–1.2)
Total Protein: 6.6 g/dL (ref 6.1–8.1)
eGFR: 98 mL/min/{1.73_m2} (ref >=60)

## 2021-06-16 ENCOUNTER — Encounter: Payer: Self-pay | Admitting: Physician Assistant

## 2021-06-16 DIAGNOSIS — M549 Dorsalgia, unspecified: Secondary | ICD-10-CM | POA: Insufficient documentation

## 2021-06-16 DIAGNOSIS — N62 Hypertrophy of breast: Secondary | ICD-10-CM | POA: Insufficient documentation

## 2021-06-16 NOTE — Progress Notes (Signed)
GREAT news your kidney function still looks great.

## 2021-08-07 ENCOUNTER — Ambulatory Visit (INDEPENDENT_AMBULATORY_CARE_PROVIDER_SITE_OTHER): Payer: BC Managed Care – PPO | Admitting: Medical-Surgical

## 2021-08-07 ENCOUNTER — Encounter: Payer: Self-pay | Admitting: Medical-Surgical

## 2021-08-07 VITALS — BP 125/75 | HR 70 | Ht 67.0 in | Wt 182.0 lb

## 2021-08-07 DIAGNOSIS — Z7689 Persons encountering health services in other specified circumstances: Secondary | ICD-10-CM | POA: Diagnosis not present

## 2021-08-07 DIAGNOSIS — F324 Major depressive disorder, single episode, in partial remission: Secondary | ICD-10-CM | POA: Diagnosis not present

## 2021-08-07 DIAGNOSIS — G44229 Chronic tension-type headache, not intractable: Secondary | ICD-10-CM

## 2021-08-07 MED ORDER — NURTEC 75 MG PO TBDP: 1.0000 | ORAL_TABLET | ORAL | 1 refills | Status: DC

## 2021-08-07 NOTE — Progress Notes (Signed)
Established Patient Office Visit  Subjective   Patient ID: Tammy Carrillo, female   DOB: December 21, 1955 Age: 66 y.o. MRN: [id number]   Chief Complaint  Patient presents with   Follow-up   HPI Pleasant 66 year old female presenting to transfer care to a new PCP and for the following:  Migraines: Started on Nurtec '75mg'$  every other day back in May. Has been doing well on this and has noted a 90% improvement in her symptoms. If she does have a headache, it is very light and very short-lived.  Notable decrease in both severity and frequency of headaches and she is very happy with this medication.  No side effects.  Mood: taking Effexor '150mg'$  daily, tolerating well without side effects. Started it about 20 years ago and has been on it ever since.  Notes that if she forgets her dose, she gets very weepy and tends to cry at even the smallest things.  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  This medication has been prescribed by her OB/GYN.   Objective:    Vitals:   08/07/21 1315  BP: 125/75  Pulse: 70  Height: '5\' 7"'$  (1.702 m)  Weight: 182 lb (82.6 kg)  SpO2: 99%  BMI (Calculated): 28.5   Physical Exam Vitals and nursing note reviewed.  Constitutional:      General: She is not in acute distress.    Appearance: Normal appearance. She is not ill-appearing.  HENT:     Head: Normocephalic and atraumatic.  Cardiovascular:     Rate and Rhythm: Normal rate and regular rhythm.     Pulses: Normal pulses.     Heart sounds: Normal heart sounds.  Pulmonary:     Effort: Pulmonary effort is normal. No respiratory distress.     Breath sounds: Normal breath sounds. No wheezing, rhonchi or rales.  Skin:    General: Skin is warm and dry.  Neurological:     Mental Status: She is alert and oriented to person, place, and time.   Psychiatric:        Mood and Affect: Mood normal.        Behavior: Behavior normal.        Thought Content: Thought content normal.        Judgment: Judgment normal.   No results found for this or any previous visit (from the past 24 hour(s)).     The ASCVD Risk score (Arnett DK, et al., 2019) failed to calculate for the following reasons:   Cannot find a previous HDL lab   Cannot find a previous total cholesterol lab   Assessment & Plan:   1. Chronic tension-type headache, not intractable Great response to Nurtec.  Continue 75 mg every other day.  Sending in refills to cover a 90-day supply. - Rimegepant Sulfate (NURTEC) 75 MG TBDP; Take 1 tablet by mouth every other day.  Dispense: 48 tablet; Refill: 1  2. Encounter to establish care Reviewed available information and discussed care concerns with patient.   3. Major depressive disorder with single episode, in partial remission (HCC) Symptoms stable.  Continue Effexor 150 mg daily.  OB/GYN is still managing this for now.  Discussed possible taper with slow dose reduction once she retires next year.  Return in about 6 months (around 02/07/2022) for migraine follow up .  ___________________________________________ Clearnce Sorrel, DNP, APRN, FNP-BC Primary Care and Sports Medicine [place]

## 2021-08-28 ENCOUNTER — Ambulatory Visit (INDEPENDENT_AMBULATORY_CARE_PROVIDER_SITE_OTHER): Payer: BC Managed Care – PPO | Admitting: Sports Medicine

## 2021-08-28 DIAGNOSIS — M7022 Olecranon bursitis, left elbow: Secondary | ICD-10-CM

## 2021-08-28 NOTE — Assessment & Plan Note (Signed)
This is a very pleasant 66 year old female, she has noted swelling left posterior elbow, no trauma, she does have to care for and lift a heavy family member. On exam she has a very visible classic olecranon bursitis without swelling, erythema, pain. Suspect traumatic olecranon bursitis, I explained the pathophysiology, it is really not bothering her so we are going to leave it alone, she can ice it and take NSAIDs. Home information sheet printed out. 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

## 2021-08-28 NOTE — Progress Notes (Signed)
    Procedures performed today:    None.  Independent interpretation of notes and tests performed by another provider:   None.  Brief History, Exam, Impression, and Recommendations:    Olecranon bursitis, left elbow This is a very pleasant 66 year old female, she has noted swelling left posterior elbow, no trauma, she does have to care for and lift a heavy family member. On exam she has a very visible classic olecranon bursitis without swelling, erythema, pain. Suspect traumatic olecranon bursitis, I explained the pathophysiology, it is really not bothering her so we are going to leave it alone, she can ice it and take NSAIDs. Home information sheet printed out. 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    ____________________________________________ Gwen Her. Dianah Field, M.D., ABFM., CAQSM., AME. Primary Care and Sports Medicine [facility]  Adjunct Professor of [place] [employer] [facility] [employer]  Risk manager

## 2021-09-16 ENCOUNTER — Ambulatory Visit (INDEPENDENT_AMBULATORY_CARE_PROVIDER_SITE_OTHER): Payer: BC Managed Care – PPO

## 2021-09-16 ENCOUNTER — Ambulatory Visit (INDEPENDENT_AMBULATORY_CARE_PROVIDER_SITE_OTHER): Payer: BC Managed Care – PPO | Admitting: Sports Medicine

## 2021-09-16 DIAGNOSIS — M7022 Olecranon bursitis, left elbow: Secondary | ICD-10-CM

## 2021-09-16 DIAGNOSIS — H6991 Unspecified Eustachian tube disorder, right ear: Secondary | ICD-10-CM | POA: Insufficient documentation

## 2021-09-16 DIAGNOSIS — H6981 Other specified disorders of Eustachian tube, right ear: Secondary | ICD-10-CM | POA: Diagnosis not present

## 2021-09-16 MED ORDER — FLUTICASONE PROPIONATE 50 MCG/ACT NA SUSP: NASAL | 3 refills | Status: DC

## 2021-09-16 MED ORDER — AZITHROMYCIN 250 MG PO TABS: ORAL_TABLET | ORAL | 0 refills | Status: DC

## 2021-09-16 NOTE — Progress Notes (Signed)
    Procedures performed today:    Procedure: Real-time Ultrasound Guided aspiration/injection of left traumatic olecranon bursitis Device: Samsung HS60  Verbal informed consent obtained.  Time-out conducted.  Noted no overlying erythema, induration, or other signs of local infection.  Skin prepped in a sterile fashion.  Local anesthesia: Topical Ethyl chloride.  With sterile technique and under real time ultrasound guidance: Noted olecranon bursitis, drained about 66m of serosanguineous fluid, syringe switched and 1 cc lidocaine, 1 cc kenalog 40 injected easily. Completed without difficulty  Advised to call if fevers/chills, erythema, induration, drainage, or persistent bleeding.  Images permanently stored and available for review in PACS.  Impression: Technically successful ultrasound guided aspiration/injection.  Independent interpretation of notes and tests performed by another provider:   None.  Brief History, Exam, Impression, and Recommendations:    Eustachian tube dysfunction, right Pleasant 66year old female, she is having a sensation of pressure right ear with popping, typically when doing nasal Valsalva. No other symptoms. On exam she does have some sclerosis of the right tympanic membrane, she also has a bit of wax and some hair in the canal but otherwise it is clear. Adding Flonase, a course of azithromycin, she will chew gum frequently, return to see me as needed.   Olecranon bursitis, left elbow Persistence of left olecranon bursitis, aspiration and injection as above, return in 6 weeks.    ____________________________________________ TGwen Her TDianah Field M.D., ABFM., CAQSM., AME. Primary Care and Sports Medicine [facility] Adjunct Professor of [place]  FRisk manager

## 2021-09-16 NOTE — Assessment & Plan Note (Signed)
Persistence of left olecranon bursitis, aspiration and injection as above, return in 6 weeks.

## 2021-09-16 NOTE — Assessment & Plan Note (Signed)
Pleasant 66 year old female, she is having a sensation of pressure right ear with popping, typically when doing nasal Valsalva. No other symptoms. On exam she does have some sclerosis of the right tympanic membrane, she also has a bit of wax and some hair in the canal but otherwise it is clear. Adding Flonase, a course of azithromycin, she will chew gum frequently, return to see me as needed.

## 2021-09-23 ENCOUNTER — Other Ambulatory Visit: Payer: Self-pay | Admitting: Physician Assistant

## 2021-09-23 DIAGNOSIS — G44229 Chronic tension-type headache, not intractable: Secondary | ICD-10-CM

## 2021-11-18 ENCOUNTER — Telehealth: Payer: Self-pay | Admitting: Neurology

## 2021-11-18 NOTE — Telephone Encounter (Signed)
Patient left vm stating she is having migraines every day, she states the Nurtec helps but only takes every other day and wants to know if it is possible to increase that to daily. She has been taking excedrin migraine to help, but it is no longer effective. Please advise and follow up with patient.

## 2021-11-19 NOTE — Telephone Encounter (Signed)
LMVM to contact the office.

## 2021-11-21 NOTE — Telephone Encounter (Signed)
Scheduled for 11/25/2021 @ 3:20. tvt

## 2021-11-21 NOTE — Telephone Encounter (Signed)
Left VM that patient would need an OV to reassess this issue. Please call pt to schedule.

## 2021-11-25 ENCOUNTER — Encounter: Payer: Self-pay | Admitting: Physician Assistant

## 2021-11-25 ENCOUNTER — Ambulatory Visit (INDEPENDENT_AMBULATORY_CARE_PROVIDER_SITE_OTHER): Payer: BC Managed Care – PPO | Admitting: Physician Assistant

## 2021-11-25 VITALS — BP 147/80 | HR 70 | Ht 67.0 in | Wt 181.0 lb

## 2021-11-25 DIAGNOSIS — R1013 Epigastric pain: Secondary | ICD-10-CM

## 2021-11-25 DIAGNOSIS — Z791 Long term (current) use of non-steroidal anti-inflammatories (NSAID): Secondary | ICD-10-CM

## 2021-11-25 DIAGNOSIS — F439 Reaction to severe stress, unspecified: Secondary | ICD-10-CM | POA: Diagnosis not present

## 2021-11-25 DIAGNOSIS — G44229 Chronic tension-type headache, not intractable: Secondary | ICD-10-CM | POA: Diagnosis not present

## 2021-11-25 MED ORDER — OMEPRAZOLE 40 MG PO CPDR: 40.0000 mg | DELAYED_RELEASE_CAPSULE | Freq: Every day | ORAL | 5 refills | Status: DC

## 2021-11-25 MED ORDER — UBRELVY 50 MG PO TABS: 1.0000 | ORAL_TABLET | ORAL | 3 refills | Status: DC | PRN

## 2021-11-25 MED ORDER — QULIPTA 10 MG PO TABS: ORAL_TABLET | ORAL | 0 refills | Status: DC

## 2021-11-25 NOTE — Progress Notes (Unsigned)
Established Patient Office Visit  Subjective   Patient ID: Tammy Carrillo, female    DOB: August 23, 1955  Age: 66 y.o. MRN: [id number]  Chief Complaint  Patient presents with   Migraine    HPI Pt is a 66 yo female who presents to the clinic to discuss migraines. She was doing well with nurtec and had a lot of improvement until the last 2 months. 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 She feels like things should be getting better though. No SI/HC. She is having headache or migraine every day. She is taking at least 4 excedrin migraine daily to get through the day. She still takes nurtec but not helping at all. She can not tolerate triptans, topamax did not work in the past. She does admit to some epigastric discomfort from time to time but no pain. Denies any melena or hematochezia.   Marland Kitchen. 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    Objective:     BP (!) 147/80   Pulse 70   Ht '5\' 7"'$  (1.702 m)   Wt 181 lb (82.1 kg)   SpO2 99%   BMI 28.35 kg/m  BP Readings from Last 3 Encounters:  11/25/21 (!) 147/80  08/07/21 125/75  06/12/21 (!) 149/80   Wt Readings from Last 3 Encounters:  11/25/21 181 lb (82.1 kg)  08/07/21 182 lb  (82.6 kg)  06/12/21 184 lb (83.5 kg)    Physical Exam Constitutional:      Appearance: Normal appearance.  Cardiovascular:     Rate and Rhythm: Normal rate and regular rhythm.  Pulmonary:     Effort: Pulmonary effort is normal.  Abdominal:     General: There is no distension.     Palpations: Abdomen is soft. There is no mass.     Tenderness: There is no abdominal tenderness. There is no right CVA tenderness, left CVA tenderness, guarding or rebound.     Hernia: No hernia is present.  Neurological:     General: No focal deficit present.     Mental Status: She is alert and oriented to person, place, and time.  Psychiatric:        Mood and Affect: Mood normal.        Assessment & Plan:  Marland KitchenMarland KitchenMarisella was seen today for migraine.  Diagnoses and all orders for this visit:  Chronic tension-type headache, not intractable -     Atogepant (QULIPTA) 10 MG TABS; Take one tablet daily for 2 weeks then increase to 2 tablets daily. -     Ubrogepant (UBRELVY) 50 MG TABS; Take 1 tablet by mouth as needed (for migraine).  Epigastric discomfort -     omeprazole (PRILOSEC) 40 MG capsule; Take 1 capsule (  40 mg total) by mouth daily.  Stress  NSAID long-term use   Concerned about NSAID overuse and development of ulcer or GI bleed.  Stop nurtec  Start qulipta for prevention and Ubrelvy for rescue(failed topamax and nurtec and triptans) Follow up with PCP in 1 month Discussed ways to reduce stress Start omeprazole daily for GI protection and LIMIT excedrin migraine use  Spent 30 minutes with patient reviewing chart, discussing medications and plan.    Iran Planas, PA-C

## 2021-11-26 ENCOUNTER — Telehealth: Payer: Self-pay

## 2021-11-26 DIAGNOSIS — F439 Reaction to severe stress, unspecified: Secondary | ICD-10-CM | POA: Insufficient documentation

## 2021-11-26 DIAGNOSIS — R1013 Epigastric pain: Secondary | ICD-10-CM | POA: Insufficient documentation

## 2021-11-26 DIAGNOSIS — Z791 Long term (current) use of non-steroidal anti-inflammatories (NSAID): Secondary | ICD-10-CM | POA: Insufficient documentation

## 2021-11-26 NOTE — Telephone Encounter (Signed)
Initiated Prior authorization FBX:[id number] '10MG'$  tablets Via: Covermymeds Case/Key:[id number] Status: approved  as of 11/26/21 Reason:this request is approved from 11/26/2021 to 11/26/2022 Notified Pt via:Pt does not have Mychart , A call was made and a message was left on pt vm explaining the approval  Initiated Prior authorization VAN:VBTYOMA '50MG'$  tablets Via: Covermymeds Case/Key:[id number] Status: approved  as of 11/26/21 Reason:this request is approved from 11/26/2021 to 11/26/2022 Notified Pt via:Pt does not have  Mychart A call was made and a message was left on pt vm explaining the approval

## 2022-01-27 ENCOUNTER — Ambulatory Visit (INDEPENDENT_AMBULATORY_CARE_PROVIDER_SITE_OTHER): Payer: BC Managed Care – PPO | Admitting: Medical-Surgical

## 2022-01-27 ENCOUNTER — Encounter: Payer: Self-pay | Admitting: Medical-Surgical

## 2022-01-27 ENCOUNTER — Ambulatory Visit (INDEPENDENT_AMBULATORY_CARE_PROVIDER_SITE_OTHER): Payer: BC Managed Care – PPO

## 2022-01-27 VITALS — BP 146/80 | HR 70 | Resp 20 | Ht 67.0 in | Wt 182.2 lb

## 2022-01-27 DIAGNOSIS — M25562 Pain in left knee: Secondary | ICD-10-CM

## 2022-01-27 DIAGNOSIS — Z13220 Encounter for screening for lipoid disorders: Secondary | ICD-10-CM

## 2022-01-27 DIAGNOSIS — G44229 Chronic tension-type headache, not intractable: Secondary | ICD-10-CM

## 2022-01-27 DIAGNOSIS — Z791 Long term (current) use of non-steroidal anti-inflammatories (NSAID): Secondary | ICD-10-CM

## 2022-01-27 DIAGNOSIS — M25442 Effusion, left hand: Secondary | ICD-10-CM | POA: Diagnosis not present

## 2022-01-27 DIAGNOSIS — Z0000 Encounter for general adult medical examination without abnormal findings: Secondary | ICD-10-CM

## 2022-01-27 NOTE — Progress Notes (Signed)
Established Patient Office Visit  Subjective   Patient ID: Tammy Carrillo, female   DOB: 17-Aug-1955 Age: 67 y.o. MRN: [id number]   Chief Complaint  Patient presents with   Headache   Knee Pain    HPI Pleasant 68 year old female presenting for the following:  Headaches: could not tolerate the Ubrelvy due to dizziness. Unable to try [place] due to insurance coverage. Has only had 1 migraine in the last month.   Left knee: twisted her knee while at the [place] game on 12/29. Has had some swelling and pain. Popping, clicking, and locking up at times when she walks. Has tried Ibuprofen, ice, elevation. Minimal relief.   A few months ago, had some swelling, redness, and warmth to a finger in the left hand. Would like to have her uric acid checked since she suspected it might be gout.    Objective:    Vitals:   01/27/22 0814  BP: (!) 146/80  Pulse: 70  Resp: 20  Height: '5\' 7"'$  (1.702 m)  Weight: 182 lb 3.2 oz (82.6 kg)  SpO2: 98%  BMI (Calculated): 28.53    Physical Exam Vitals and nursing note reviewed.  Constitutional:      General: She is not in acute distress.    Appearance: Normal appearance. She is well-developed. She is obese. She is not ill-appearing.  HENT:     Head: Normocephalic and atraumatic.  Cardiovascular:     Rate and Rhythm: Normal rate and regular rhythm.     Pulses: Normal pulses.     Heart sounds: Normal heart sounds.  Pulmonary:     Effort: Pulmonary effort is normal. No respiratory distress.     Breath sounds: Normal breath sounds. No wheezing, rhonchi or rales.  Musculoskeletal:     Right knee: Normal.     Left knee: Swelling (mild) and crepitus present. Tenderness present.  Skin:    General: Skin is warm and dry.  Neurological:     Mental Status: She is alert and oriented to person, place, and time.  Psychiatric:        Mood and Affect: Mood normal.        Behavior: Behavior normal.        Thought Content: Thought content normal.         Judgment: Judgment normal.   No results found for this or any previous visit (from the past 24 hour(s)).     The ASCVD Risk score (Arnett DK, et al., 2019) failed to calculate for the following reasons:   Cannot find a previous HDL lab   Cannot find a previous total cholesterol lab   Assessment & Plan:   1. Chronic tension-type headache, not intractable Continue as needed Excedrin Migraine.  Make sure to limit this to no more than twice a week if at all possible.  2. NSAID long-term use Checking CMP. - COMPLETE METABOLIC PANEL WITH GFR  3. Swelling of finger joint of left hand Checking uric acid.  Swelling has resolved but she would like to know if this is possibly related to gout. - Uric acid  4. Lipid screening Checking lipid panel. - Lipid panel  5. Preventative health care Checking CBC with differential. - CBC with Differential/Platelet  6. Acute pain of left knee Exam showing lateral lower knee pain with crepitus and reports of acute mechanical symptoms since injury.  We will go ahead and get x-rays today.  Once those have been completed, plan for MRI to evaluate for meniscal tear. -  DG Knee Complete 4 Views Left; Future  7. Elevated blood pressure reading Slightly elevated on arrival and again on recheck. Likely related to pain but advised to check blood pressures at home with a goal of 130/80 or less.  Limit dietary sodium.  If blood pressure is consistently higher than goal, return for further evaluation and medication management.  Return if symptoms worsen or fail to improve.  ___________________________________________ Clearnce Sorrel, DNP, APRN, FNP-BC Primary Care and [place]

## 2022-01-28 ENCOUNTER — Other Ambulatory Visit: Payer: Self-pay | Admitting: Medical-Surgical

## 2022-01-28 DIAGNOSIS — M25562 Pain in left knee: Secondary | ICD-10-CM

## 2022-01-28 DIAGNOSIS — M2392 Unspecified internal derangement of left knee: Secondary | ICD-10-CM

## 2022-01-28 LAB — CBC WITH DIFFERENTIAL/PLATELET
Absolute Monocytes: 476 cells/uL (ref 200–950)
Basophils Absolute: 41 cells/uL (ref 0–200)
Basophils Relative: 0.6 %
Eosinophils Absolute: 394 cells/uL (ref 15–500)
Eosinophils Relative: 5.8 %
HCT: 43.6 % (ref 35.0–45.0)
Hemoglobin: 15 g/dL (ref 11.7–15.5)
Lymphs Abs: 1489 cells/uL (ref 850–3900)
MCH: 31.5 pg (ref 27.0–33.0)
MCHC: 34.4 g/dL (ref 32.0–36.0)
MCV: 91.6 fL (ref 80.0–100.0)
MPV: 11 fL (ref 7.5–12.5)
Monocytes Relative: 7 %
Neutro Abs: 4400 cells/uL (ref 1500–7800)
Neutrophils Relative %: 64.7 %
Platelets: 311 10*3/uL (ref 140–400)
RBC: 4.76 10*6/uL (ref 3.80–5.10)
RDW: 12.2 % (ref 11.0–15.0)
Total Lymphocyte: 21.9 %
WBC: 6.8 10*3/uL (ref 3.8–10.8)

## 2022-01-28 LAB — COMPLETE METABOLIC PANEL WITH GFR
AG Ratio: 1.7 (calc) (ref 1.0–2.5)
ALT: 12 U/L (ref 6–29)
AST: 14 U/L (ref 10–35)
Albumin: 4.2 g/dL (ref 3.6–5.1)
Alkaline phosphatase (APISO): 68 U/L (ref 37–153)
BUN: 14 mg/dL (ref 7–25)
CO2: 33 mmol/L — ABNORMAL HIGH (ref 20–32)
Calcium: 10.2 mg/dL (ref 8.6–10.4)
Chloride: 101 mmol/L (ref 98–110)
Creat: 0.7 mg/dL (ref 0.50–1.05)
Globulin: 2.5 g/dL (calc) (ref 1.9–3.7)
Glucose, Bld: 88 mg/dL (ref 65–99)
Potassium: 4.7 mmol/L (ref 3.5–5.3)
Sodium: 143 mmol/L (ref 135–146)
Total Bilirubin: 0.6 mg/dL (ref 0.2–1.2)
Total Protein: 6.7 g/dL (ref 6.1–8.1)
eGFR: 96 mL/min/{1.73_m2} (ref >=60)

## 2022-01-28 LAB — LIPID PANEL
Cholesterol: 226 mg/dL — ABNORMAL HIGH (ref <200)
HDL: 60 mg/dL (ref >=50)
LDL Cholesterol (Calc): 135 mg/dL (calc) — ABNORMAL HIGH
Non-HDL Cholesterol (Calc): 166 mg/dL (calc) — ABNORMAL HIGH (ref <130)
Total CHOL/HDL Ratio: 3.8 (calc) (ref <5.0)
Triglycerides: 178 mg/dL — ABNORMAL HIGH (ref <150)

## 2022-01-28 LAB — URIC ACID: Uric Acid, Serum: 4.3 mg/dL (ref 2.5–7.0)

## 2022-02-07 ENCOUNTER — Ambulatory Visit (INDEPENDENT_AMBULATORY_CARE_PROVIDER_SITE_OTHER): Payer: BC Managed Care – PPO

## 2022-02-07 DIAGNOSIS — M2392 Unspecified internal derangement of left knee: Secondary | ICD-10-CM | POA: Diagnosis not present

## 2022-02-07 DIAGNOSIS — M25562 Pain in left knee: Secondary | ICD-10-CM

## 2022-02-09 ENCOUNTER — Encounter: Payer: Self-pay | Admitting: Medical-Surgical

## 2022-02-09 ENCOUNTER — Ambulatory Visit (INDEPENDENT_AMBULATORY_CARE_PROVIDER_SITE_OTHER): Payer: BC Managed Care – PPO | Admitting: Medical-Surgical

## 2022-02-09 VITALS — BP 114/72 | HR 72 | Resp 20 | Ht 67.0 in | Wt 181.9 lb

## 2022-02-09 DIAGNOSIS — M1712 Unilateral primary osteoarthritis, left knee: Secondary | ICD-10-CM

## 2022-02-09 DIAGNOSIS — G44229 Chronic tension-type headache, not intractable: Secondary | ICD-10-CM | POA: Diagnosis not present

## 2022-02-09 DIAGNOSIS — S83282D Other tear of lateral meniscus, current injury, left knee, subsequent encounter: Secondary | ICD-10-CM | POA: Diagnosis not present

## 2022-02-09 NOTE — Progress Notes (Signed)
Established Patient Office Visit  Subjective   Patient ID: Tammy Carrillo, female   DOB: May 02, 1955 Age: 68 y.o. MRN: [id number]   Chief Complaint  Patient presents with   Follow-up   Migraine   HPI Pleasant 67 year old female presenting today for the following:  Migraine: Reports having a severe migraine that started on Friday night and lasted the weekend.  She did take a couple of doses of Excedrin Migraine but is aware that she needs to limit this to prevent rebound headaches.  Feels that the migraines are stress related and admits to increased stress at work.  Tolerates the Excedrin Migraine well and reports that it does not make her feel bad or funny.  Intolerant to triptans.  Knee pain: Had her MRI done over the weekend.  Continues to have pain in the left knee along with mechanical symptoms.  Objective:    Vitals:   02/09/22 1556  BP: 114/72  Pulse: 72  Resp: 20  Height: '5\' 7"'$  (1.702 m)  Weight: 181 lb 14.4 oz (82.5 kg)  SpO2: 96%  BMI (Calculated): 28.48    Physical Exam Vitals and nursing note reviewed.  Constitutional:      General: She is not in acute distress.    Appearance: Normal appearance. She is obese. She is not ill-appearing.  HENT:     Head: Normocephalic and atraumatic.  Cardiovascular:     Rate and Rhythm: Normal rate and regular rhythm.     Pulses: Normal pulses.     Heart sounds: Normal heart sounds.  Pulmonary:     Effort: Pulmonary effort is normal. No respiratory distress.     Breath sounds: Normal breath sounds. No wheezing, rhonchi or rales.  Skin:    General: Skin is warm and dry.  Neurological:     Mental Status: She is alert and oriented to person, place, and time.  Psychiatric:        Mood and Affect: Mood normal.        Behavior: Behavior normal.        Thought Content: Thought content normal.        Judgment: Judgment normal.   No results found for this or any previous visit (from the past 24 hour(s)).     The 10-year  ASCVD risk score (Arnett DK, et al., 2019) is: 4.6%   Values used to calculate the score:     Age: 61 years     Sex: Female     Is Non-Hispanic African American: No     Diabetic: No     Tobacco smoker: No     Systolic Blood Pressure: 244 mmHg     Is BP treated: No     HDL Cholesterol: 60 mg/dL     Total Cholesterol: 226 mg/dL   Assessment & Plan:   1. Chronic tension-type headache, not intractable Continue sparing use of Excedrin Migraine for severe headaches.  Employee other conservative measures at home including ice, rest, and stress management.  2. Primary osteoarthritis of left knee 3. Other tear of lateral meniscus of left knee as current injury, subsequent encounter Reviewed MRI results with patient in person.  Discussed multiple issues that were found on her MRI for the left knee.  Discussed a steroid injection versus referral to orthopedic surgery.  Patient feels that at some point, she is going to need to have the knee surgically addressed and would prefer to go ahead and have a referral for now. - Ambulatory referral to Orthopedic  Surgery  Return if symptoms worsen or fail to improve.  ___________________________________________ Clearnce Sorrel, DNP, APRN, FNP-BC Primary Care and [place]

## 2022-03-06 ENCOUNTER — Telehealth: Payer: Self-pay

## 2022-03-06 NOTE — Telephone Encounter (Signed)
Patient called wanting to know if she can be referred to [facility] instead of the one in [place].

## 2022-03-10 NOTE — Telephone Encounter (Signed)
Referral, clinical notes and copies of insurance cards have been faxed to [facility] at [phone]. Office will contact patient to schedule referral appointment.

## 2022-04-13 ENCOUNTER — Telehealth: Payer: Self-pay | Admitting: General Practice

## 2022-04-13 NOTE — Transitions of Care (Post Inpatient/ED Visit) (Signed)
   04/13/2022  Name: SIDONIE ROSANDER MRN: [id number] DOB: Jun 08, 1955  Today's TOC FU Call Status: Today's TOC FU Call Status:: Unsuccessul Call (1st Attempt) Unsuccessful Call (1st Attempt) Date: 04/13/22  Attempted to reach the patient regarding the most recent Inpatient/ED visit.  Follow Up Plan: Additional outreach attempts will be made to reach the patient to complete the Transitions of Care (Post Inpatient/ED visit) call.   Signature Tinnie Gens, RN BSN

## 2022-04-16 NOTE — Transitions of Care (Post Inpatient/ED Visit) (Signed)
   04/16/2022  Name: Tammy Carrillo MRN: [id number] DOB: Jun 20, 1955  Today's TOC FU Call Status: Today's TOC FU Call Status:: Unsuccessful Call (2nd Attempt) Unsuccessful Call (1st Attempt) Date: 04/13/22 Unsuccessful Call (2nd Attempt) Date: 04/16/22  Attempted to reach the patient regarding the most recent Inpatient/ED visit.  Follow Up Plan: Additional outreach attempts will be made to reach the patient to complete the Transitions of Care (Post Inpatient/ED visit) call.   Signature Tinnie Gens, RN BSN

## 2022-04-17 NOTE — Transitions of Care (Post Inpatient/ED Visit) (Signed)
   04/17/2022  Name: Tammy Carrillo MRN: [id number] DOB: 24-Jul-1955  Today's TOC FU Call Status: Today's TOC FU Call Status:: Unsuccessful Call (3rd Attempt) Unsuccessful Call (1st Attempt) Date: 04/13/22 Unsuccessful Call (2nd Attempt) Date: 04/16/22 Unsuccessful Call (3rd Attempt) Date: 04/17/22  Attempted to reach the patient regarding the most recent Inpatient/ED visit.  Follow Up Plan: No further outreach attempts will be made at this time. We have been unable to contact the patient.  Signature Tinnie Gens, RN BSN

## 2022-09-03 ENCOUNTER — Encounter: Payer: Self-pay | Admitting: Medical-Surgical

## 2022-10-14 ENCOUNTER — Telehealth: Payer: Self-pay | Admitting: General Practice

## 2022-10-14 NOTE — Transitions of Care (Post Inpatient/ED Visit) (Signed)
10/14/2022  Name: Tammy Carrillo MRN: [id number] DOB: 1955-07-14  Today's TOC FU Call Status: Today's TOC FU Call Status:: Unsuccessful Call (1st Attempt) Unsuccessful Call (1st Attempt) Date: 10/14/22  Attempted to reach the patient regarding the most recent Inpatient/ED visit.  Follow Up Plan: Additional outreach attempts will be made to reach the patient to complete the Transitions of Care (Post Inpatient/ED visit) call.   Signature [place] Charon, Control and instrumentation engineer

## 2022-10-19 NOTE — Transitions of Care (Post Inpatient/ED Visit) (Signed)
10/19/2022  Name: Tammy Carrillo MRN: [id number] DOB: 30-Mar-1955  Today's TOC FU Call Status: Today's TOC FU Call Status:: Unsuccessful Call (2nd Attempt) Unsuccessful Call (1st Attempt) Date: 10/14/22 Unsuccessful Call (2nd Attempt) Date: 10/19/22  Attempted to reach the patient regarding the most recent Inpatient/ED visit.  Follow Up Plan: Additional outreach attempts will be made to reach the patient to complete the Transitions of Care (Post Inpatient/ED visit) call.   Signature [place] Charon, Control and instrumentation engineer

## 2022-10-20 NOTE — Transitions of Care (Post Inpatient/ED Visit) (Signed)
10/20/2022  Name: ROWE YABLONSKI MRN: [id number] DOB: 03/05/1955  Today's TOC FU Call Status: Today's TOC FU Call Status:: Unsuccessful Call (3rd Attempt) Unsuccessful Call (1st Attempt) Date: 10/14/22 Unsuccessful Call (2nd Attempt) Date: 10/19/22 Unsuccessful Call (3rd Attempt) Date: 10/20/22  Attempted to reach the patient regarding the most recent Inpatient/ED visit.  Follow Up Plan: No further outreach attempts will be made at this time. We have been unable to contact the patient.  Signature [place] Charon, Control and instrumentation engineer

## 2023-01-29 ENCOUNTER — Encounter: Payer: Self-pay | Admitting: Medical-Surgical

## 2023-01-29 ENCOUNTER — Ambulatory Visit (INDEPENDENT_AMBULATORY_CARE_PROVIDER_SITE_OTHER): Payer: 59 | Admitting: Medical-Surgical

## 2023-01-29 VITALS — BP 131/81 | HR 77 | Temp 98.3°F | Resp 20 | Ht 67.0 in | Wt 177.8 lb

## 2023-01-29 DIAGNOSIS — J069 Acute upper respiratory infection, unspecified: Secondary | ICD-10-CM

## 2023-01-29 MED ORDER — ALBUTEROL SULFATE HFA 108 (90 BASE) MCG/ACT IN AERS
2.0000 | INHALATION_SPRAY | Freq: Four times a day (QID) | RESPIRATORY_TRACT | 0 refills | Status: DC | PRN
Start: 2023-01-29 — End: 2023-06-01

## 2023-01-29 MED ORDER — METHYLPREDNISOLONE 4 MG PO TBPK
ORAL_TABLET | ORAL | 0 refills | Status: DC
Start: 2023-01-29 — End: 2023-03-30

## 2023-01-29 NOTE — Progress Notes (Signed)
        Established patient visit  History, exam, impression, and plan:  1. Upper respiratory tract infection, unspecified type (Primary) Pleasant 68 year old female presenting today with reports of URI symptoms that started last week with a severe sore throat and fever. Her symptoms progressed to include a nighttime cough, chills, mid back pain, mid chest pain, and pain on breathing. She was seen on Sunday at [facility] where they treated her with Augmentin  BID x 5 days. She notes that her sore throat is better but not completely. She is concerned about possible pneumonia due to the pain with breathing. On exam, lungs are CTA, even/unlabored respirations. HRR, S1/S2 normal. Bilateral serous MEE noted. Posterior oropharyngeal erythema without exudate. Mild cervical lymphadenopathy. No maxillary sinus tenderness. Suspect her symptoms have progressed to bronchitis. She has completed her antibiotic therapy. Adding a medrol  dose pack and recommend continued symptom management with OTC and home treatments. If symptoms fail to improve, advised her to reach back out. Offered chest x-ray today but this was declined. Refilling prn albuterol  inhaler.    Procedures performed this visit: None.  Return if symptoms worsen or fail to improve.  __________________________________ Zada FREDRIK Palin, DNP, APRN, FNP-BC Primary Care and Sports Medicine [facility] [place]

## 2023-02-01 ENCOUNTER — Telehealth: Payer: Self-pay

## 2023-02-01 MED ORDER — AZITHROMYCIN 250 MG PO TABS: ORAL_TABLET | ORAL | 0 refills | Status: AC

## 2023-02-01 NOTE — Telephone Encounter (Signed)
 Azithromycin sent to the pharmacy.   ___________________________________________ Thayer Ohm, DNP, APRN, FNP-BC Primary Care and Sports Medicine [facility] [place]

## 2023-02-01 NOTE — Telephone Encounter (Signed)
 ABX extesion request  Copied from CRM [id number]. Topic: Clinical - Medical Advice >> Feb 01, 2023  8:55 AM Tammy Carrillo wrote: Reason for CRM: Patient calling to request an extension or alternative antibiotic be prescribed. She states that Franky (?) said he prescribed her an extension but the pharmacy never got it. Patient is not feeling better and has gone the weekend without medicine.

## 2023-02-01 NOTE — Telephone Encounter (Signed)
 Copied from CRM [id number]. Topic: Clinical - Medication Question >> Jan 29, 2023  4:29 PM Franky GRADE wrote: Reason for CRM: Patient was seen by Zada Palin today 01/29/2023 and they discussed an antibiotic. Patient is calling to ask Joy to send the prescription to the pharmacy if possible.

## 2023-02-01 NOTE — Telephone Encounter (Signed)
 See other telephone message

## 2023-02-02 NOTE — Telephone Encounter (Signed)
Left message advising of antibiotic.  

## 2023-03-30 ENCOUNTER — Ambulatory Visit (INDEPENDENT_AMBULATORY_CARE_PROVIDER_SITE_OTHER): Admitting: Family Medicine

## 2023-03-30 ENCOUNTER — Encounter: Payer: Self-pay | Admitting: Family Medicine

## 2023-03-30 ENCOUNTER — Ambulatory Visit

## 2023-03-30 ENCOUNTER — Ambulatory Visit: Payer: Self-pay | Admitting: Medical-Surgical

## 2023-03-30 ENCOUNTER — Telehealth: Payer: Self-pay

## 2023-03-30 VITALS — BP 123/73 | HR 83 | Ht 67.0 in | Wt 176.0 lb

## 2023-03-30 DIAGNOSIS — M79605 Pain in left leg: Secondary | ICD-10-CM | POA: Diagnosis not present

## 2023-03-30 LAB — CBC WITH DIFFERENTIAL/PLATELET
Basophils Absolute: 0 10*3/uL (ref 0.0–0.2)
Basos: 1 %
EOS (ABSOLUTE): 0.4 10*3/uL (ref 0.0–0.4)
Eos: 5 %
Hematocrit: 43.4 % (ref 34.0–46.6)
Hemoglobin: 14.5 g/dL (ref 11.1–15.9)
Immature Granulocytes: 0 %
Lymphocytes Absolute: 1.8 10*3/uL (ref 0.7–3.1)
Lymphs: 22 %
MCH: 31.1 pg (ref 26.6–33.0)
MCHC: 33.4 g/dL (ref 31.5–35.7)
MCV: 93 fL (ref 79–97)
Monocytes Absolute: 0.5 10*3/uL (ref 0.1–0.9)
Monocytes: 6 %
Neutrophils Absolute: 5.5 10*3/uL (ref 1.4–7.0)
Neutrophils: 66 %
Platelets: 276 10*3/uL (ref 150–450)
RBC: 4.66 x10E6/uL (ref 3.77–5.28)
RDW: 12.6 % (ref 11.7–15.4)
WBC: 8.3 10*3/uL (ref 3.4–10.8)

## 2023-03-30 MED ORDER — HYDROCODONE-ACETAMINOPHEN 5-325 MG PO TABS: 1.0000 | ORAL_TABLET | Freq: Four times a day (QID) | ORAL | 0 refills | Status: DC | PRN

## 2023-03-30 NOTE — Progress Notes (Addendum)
   Acute Office Visit  Subjective:     Patient ID: Tammy Carrillo, female    DOB: 09/23/1955, 68 y.o.   MRN: [id number]  Chief Complaint  Patient presents with   thigh pain         HPI Patient is in today for pain on post thigh that started a couple of days ago, on Sunday.  The area affected almost felt like a circular area little bit bigger than the size of a coin.  It was incredibility intense. Tried heat, ice, Idocaine patch.  No relief she then tried taking 2 Aleve and then later on took couple ibuprofen was still without relief.  She even tried taking a hot bath.  She then ended up trying her husband's Celebrex with no relief and then gabapentin with no relief.  She said she finally fell asleep and by the time she woke up the next morning the pain was completely gone it is not worse with position change or movement of the leg. Then last night on Monday night the pain came back again and again was incredibly intense tried heat ice ibuprofen and Aleve again.  Again with no relief.  She has not noticed any trauma injury or decreased range of motion she has not noticed any swelling in that leg no weakness in the extremities.  She does occasionally have some low back pain if she overdoes it but nothing out of the usual lately.  She has not noticed any rash.  ROS      Objective:    BP 123/73   Pulse 83   Ht 5\' 7"  (1.702 m)   Wt 176 lb (79.8 kg)   SpO2 97%   BMI 27.57 kg/m    Physical Exam  No results found for any visits on 03/30/23.      Assessment & Plan:   Problem List Items Addressed This Visit   None Visit Diagnoses       Left leg pain    -  Primary   Relevant Orders   D-Dimer, Quantitative   DG FEMUR MIN 2 VIEWS LEFT   CBC with Differential/Platelet      The leg pain was very localized she said it felt like it was deep most radiating out.  But not down the leg.  She has not noticed any rash to suggest shingles though I did encourage her to keep an eye out  for new onset rash in the next couple of days.  Nothing seems to touch the pain wants it hurts it does not seem to be triggered with motion or position.  We discussed getting an x-ray of the femur just to make sure there is no underlying mass or cyst etc.  It is unlikely to be a DVT as I do not see any swelling in that extremity but I am going to check a stat D-dimer as well as check a CBC.  She is to let me know if she develops any new symptoms or worsening symptoms.  That she tried multiple pain medications we did discuss just a small quantity of hydrocodone to try if it happens again.  Will continue the workup for now.  If it gets very severe then recommend that she go to the emergency department.  No orders of the defined types were placed in this encounter.   No follow-ups on file.  Nani Gasser, MD

## 2023-03-30 NOTE — Telephone Encounter (Signed)
 Copied from CRM [id number]. Topic: Clinical - Medication Question >> Mar 30, 2023  4:47 PM Martha Clan wrote: Reason for CRM: Patient had an appointment earlier today with DR Linford Arnold. Patient states the Dr was going to put in for a prescription of pain medication for short term relief.

## 2023-03-30 NOTE — Telephone Encounter (Signed)
 Yes,  I apologize. Will send righ tnow.

## 2023-03-30 NOTE — Addendum Note (Signed)
 Addended by: Nani Gasser D on: 03/30/2023 05:19 PM   Modules accepted: Orders

## 2023-03-30 NOTE — Telephone Encounter (Signed)
  Chief Complaint: L thigh pain Symptoms: severe pain Frequency: since Monday Pertinent Negatives: Patient denies fever, CP, SOB, warmth/redness at site Disposition: [] ED /[] Urgent Care (no appt availability in office) / [x] Appointment(In office/virtual)/ []  [place] [facility]/ [] Home Care/ [] Refused Recommended Disposition /[] [place] [facility]/ []  Follow-up with PCP Additional Notes: Pt c/o L thigh pain since last night. Reports taking multiple pain medications/home remedies with no relief. Denies any recent injuries to site, as well as warmth/redness. Scheduled patient per protocol on 03/30/2023. Of note, pt able to bear weight. Patient verbalized understanding and to call back with worsening symptoms.    Copied from CRM [id number]. Topic: Clinical - Red Word Triage >> Mar 30, 2023  8:40 AM Nila Nephew wrote: Red Word that prompted transfer to Nurse Triage: Localized left leg pain on the back of thigh - concern for nerve or blood clot. Severe pain, nothing is helping, unable to locate the problem area by touch. Reason for Disposition  [1] SEVERE pain (e.g., excruciating, unable to do any normal activities) AND [2] not improved after 2 hours of pain medicine  Answer Assessment - Initial Assessment Questions 1. ONSET: "When did the pain start?"      Monday - pain woke her up in middle of night, took aleve with no relief 2. LOCATION: "Where is the pain located?"      L leg thigh 3. PAIN: "How bad is the pain?"    (Scale 1-10; or mild, moderate, severe)   -  MILD (1-3): doesn't interfere with normal activities    -  MODERATE (4-7): interferes with normal activities (e.g., work or school) or awakens from sleep, limping    -  SEVERE (8-10): excruciating pain, unable to do any normal activities, unable to walk     moderate 4. WORK OR EXERCISE: "Has there been any recent work or exercise that involved this part of the body?"      Reports getting cortisone shot in same knee, but recent x ray  unremarkable Denies injuries/new exercises 5. CAUSE: "What do you think is causing the leg pain?"     Initially thought blood clot, but husband is EMT and ruled out sx 6. OTHER SYMPTOMS: "Do you have any other symptoms?" (e.g., chest pain, back pain, breathing difficulty, swelling, rash, fever, numbness, weakness)     Denies warmth Took aleve, ibuprofen, celebrex, gabapentin last night with no relief. Also tried ice, heat, elevation with no relief.  Protocols used: Leg Pain-A-AH

## 2023-03-31 LAB — D-DIMER, QUANTITATIVE: D-DIMER: 0.47 mg{FEU}/L (ref 0.00–0.49)

## 2023-03-31 NOTE — Telephone Encounter (Signed)
 Patient seen in office 03/30/23

## 2023-03-31 NOTE — Telephone Encounter (Signed)
 Patient informed and did get the prescription

## 2023-03-31 NOTE — Progress Notes (Signed)
 Call pt: No sign of blood clot

## 2023-04-05 ENCOUNTER — Ambulatory Visit: Payer: Self-pay | Admitting: Medical-Surgical

## 2023-04-05 ENCOUNTER — Ambulatory Visit (INDEPENDENT_AMBULATORY_CARE_PROVIDER_SITE_OTHER): Admitting: Family Medicine

## 2023-04-05 ENCOUNTER — Encounter: Payer: Self-pay | Admitting: Family Medicine

## 2023-04-05 VITALS — BP 130/84 | HR 88 | Temp 98.2°F | Wt 181.0 lb

## 2023-04-05 DIAGNOSIS — G5702 Lesion of sciatic nerve, left lower limb: Secondary | ICD-10-CM | POA: Diagnosis not present

## 2023-04-05 MED ORDER — TIZANIDINE HCL 4 MG PO TABS: 2.0000 mg | ORAL_TABLET | Freq: Three times a day (TID) | ORAL | 0 refills | Status: DC | PRN

## 2023-04-05 NOTE — Progress Notes (Signed)
 Tammy Carrillo , [birth year], 68 y.o., female MRN: [id number] Patient Care Team    Relationship Specialty Notifications Start End  Christen Butter, NP PCP - General Nurse Practitioner  08/07/21     Chief Complaint  Patient presents with   Leg Pain    L Leg, a little over a week. Pt was seen 3/4; was prescribed Hydrocodone but has found no relief with pain. Imaging results have not been read. Pt states pain is the worst during the night, causing sleep disturbance.      Subjective: Tammy Carrillo is a 68 y.o. Pt presents for an OV with complaints of left posterior thigh pain of ~ 10 days duration.  Associated symptoms include severe intense undescribable pain posterior thigh. She as seen 03/30/2023 by her PCP office and xray was obtained of her femur. Xray is has not been formally read by radiology as of yet.  She denies any injury that she is aware of. She does exercise, swims routinely. She has knee arthritis and has steroid injections through orthopedic.  Pt reports the first night she experienced the discomfort she had to get up and walk to ease the pain. She went to have a deep muscle massage and she reports it made her discomfort increase. Pain is along posterior central thigh. Pain is only present when she is at rest.   Pt has tried advil, hydrocodone, celebrex, gabapentin to ease their symptoms. None of which helped much.     01/29/2023    2:28 PM 02/09/2022    3:58 PM 01/27/2022   11:05 AM 03/09/2018    2:41 PM 09/03/2016    1:21 PM  Depression screen PHQ 2/9  Decreased Interest 0 0 0 0 3  Down, Depressed, Hopeless 0 1 0 0 1  PHQ - 2 Score 0 1 0 0 4  Altered sleeping 0  0 0 2  Tired, decreased energy 1  0 3 2  Change in appetite 0  0 2 2  Feeling bad or failure about yourself  0  0 0 0  Trouble concentrating 0  0 0 1  Moving slowly or fidgety/restless 0  0 0 0  Suicidal thoughts 0  0 0 0  PHQ-9 Score 1  0 5 11  Difficult doing work/chores Not difficult at all   Not difficult  at all     Allergies  Allergen Reactions   Triptans     "Electric face"   Social History   Social History Narrative   Not on file   Past Medical History:  Diagnosis Date   Depression    Migraines    Past Surgical History:  Procedure Laterality Date   CHOLECYSTECTOMY     Family History  Problem Relation Age of Onset   Cancer Father    Stroke Father    Diabetes Father    Cancer Sister        breast   COPD Mother    Stroke Maternal Grandmother    Heart attack Paternal Grandmother    Allergies as of 04/05/2023       Reactions   Triptans    "Electric face"        Medication List        Accurate as of April 05, 2023  2:40 PM. If you have any questions, ask your nurse or doctor.          albuterol 108 (90 Base) MCG/ACT inhaler Commonly known as: VENTOLIN HFA  Inhale 2 puffs into the lungs every 6 (six) hours as needed for wheezing or shortness of breath.   aspirin-acetaminophen-caffeine 250-250-65 MG tablet Commonly known as: EXCEDRIN MIGRAINE Take 2 tablets by mouth in the morning and at bedtime.   HYDROcodone-acetaminophen 5-325 MG tablet Commonly known as: NORCO/VICODIN Take 1 tablet by mouth every 6 (six) hours as needed for moderate pain (pain score 4-6).   lansoprazole 30 MG capsule Commonly known as: PREVACID Take by mouth.   Multivitamin Adults 50+ Tabs multivitamin   tiZANidine 4 MG tablet Commonly known as: Zanaflex Take 0.5-1 tablets (2-4 mg total) by mouth every 8 (eight) hours as needed for muscle spasms. Started by: Felix Pacini   venlafaxine XR 150 MG 24 hr capsule Commonly known as: EFFEXOR-XR Take 150 mg by mouth daily.        All past medical history, surgical history, allergies, family history, immunizations andmedications were updated in the EMR today and reviewed under the history and medication portions of their EMR.     ROS Negative, with the exception of above mentioned in HPI   Objective:  BP 130/84   Pulse 88    Temp 98.2 F (36.8 C)   Wt 181 lb (82.1 kg)   SpO2 97%   BMI 28.35 kg/m  Body mass index is 28.35 kg/m. Physical Exam Vitals and nursing note reviewed.  Constitutional:      General: She is not in acute distress.    Appearance: Normal appearance. She is normal weight. She is not ill-appearing or toxic-appearing.  HENT:     Head: Normocephalic and atraumatic.  Eyes:     General: No scleral icterus.       Right eye: No discharge.        Left eye: No discharge.     Extraocular Movements: Extraocular movements intact.     Conjunctiva/sclera: Conjunctivae normal.     Pupils: Pupils are equal, round, and reactive to light.  Musculoskeletal:        General: Tenderness present. No swelling, deformity or signs of injury. Normal range of motion.     Right lower leg: No edema.     Left lower leg: No edema.     Comments: Left LE: no erythema, bruising or swelling. Left piriformis TTP. Hamstrings TTP and spasms present. NROM. NV intact distally.    Skin:    Findings: No rash.  Neurological:     Mental Status: She is alert and oriented to person, place, and time. Mental status is at baseline.     Motor: No weakness.     Coordination: Coordination normal.     Gait: Gait normal.  Psychiatric:        Mood and Affect: Mood normal.        Behavior: Behavior normal.        Thought Content: Thought content normal.        Judgment: Judgment normal.     No results found. No results found. No results found for this or any previous visit (from the past 24 hours).  Assessment/Plan: Tammy Carrillo is a 68 y.o. female present for OV for  Sciatic nerve disease, left (Primary) Suspect her pain is sciatic nerve pain.  She is tender over left piriformis and her hamstrings are tight.  We discussed stretches BID and heat application today.  Zanaflex 1-4 mg TID PRN prescribed with sedation precautions. Reviewed xray, did not appear to have any acute changes.  Follow up with pcp in 1-2 weeks,  sooner if worsening. Could consider PT.    Reviewed expectations re: course of current medical issues. Discussed self-management of symptoms. Outlined signs and symptoms indicating need for more acute intervention. Patient verbalized understanding and all questions were answered. Patient received an After-Visit Summary.    No orders of the defined types were placed in this encounter.  Meds ordered this encounter  Medications   tiZANidine (ZANAFLEX) 4 MG tablet    Sig: Take 0.5-1 tablets (2-4 mg total) by mouth every 8 (eight) hours as needed for muscle spasms.    Dispense:  60 tablet    Refill:  0   Referral Orders  No referral(s) requested today     Note is dictated utilizing voice recognition software. Although note has been proof read prior to signing, occasional typographical errors still can be missed. If any questions arise, please do not hesitate to call for verification.   electronically signed by:  Felix Pacini, DO  [facility] [place]

## 2023-04-05 NOTE — Patient Instructions (Signed)

## 2023-04-05 NOTE — Telephone Encounter (Signed)
  Chief Complaint: left leg pain Symptoms: left hip/buttocks numbness, worsening left leg pain, nodules along left leg/thigh (and one in jaw/gum) Frequency: started 03/28/23, worsening 03/31/23, nodules noted 2 days ago and numbness x 1 day Pertinent Negatives: Patient denies bruising, swelling, warmth, redness, chest pain, difficulty breathing, rash, fever, changes in speech/vision. Disposition: [] ED /[] Urgent Care (no appt availability in office) / [x] Appointment(In office/virtual)/ []  [place] [facility]/ [] Home Care/ [] Refused Recommended Disposition /[] [place] [facility]/ []  Follow-up with PCP Additional Notes: Patient seen on 03/30/23 for left leg pain. Patient states what has changed is that is has moved toward her hip, about 8 nodules up and down left leg back of thigh/hip/inner thigh/middle right thigh/jaw(painful to touch), and numbness in hip/buttocks. She states she has taken hydrocodone, aleve, ibuprofen and has attempted massaging it. She states the pain is worst at night. Patient states she has been able to ambulate bear weight on left leg.  Copied from CRM [id number]. Topic: Clinical - Red Word Triage >> Apr 05, 2023  9:03 AM Nila Nephew wrote: Red Word that prompted transfer to Nurse Triage: Persisting pain in leg - thinks nerve related due to shooting pain - one area of leg is numb with new nodules. Reason for Disposition  [1] SEVERE pain (e.g., excruciating, unable to do any normal activities) AND [2] not improved after 2 hours of pain medicine  Answer Assessment - Initial Assessment Questions 1. ONSET: "When did the pain start?"      03/28/23, states it started about a quarter size in the back of her leg.  2. LOCATION: "Where is the pain located?"      Left leg.  3. PAIN: "How bad is the pain?"    (Scale 1-10; or mild, moderate, severe)   -  MILD (1-3): doesn't interfere with normal activities    -  MODERATE (4-7): interferes with normal activities (e.g., work or school) or  awakens from sleep, limping    -  SEVERE (8-10): excruciating pain, unable to do any normal activities, unable to walk     10/10, states she has taken ibuprofen this morning and is lying in the bath tub to help with pain.  4. WORK OR EXERCISE: "Has there been any recent work or exercise that involved this part of the body?"      Patient denies any exercise or overuse of leg. She states she has gone swimming and done light yoga stretching.  5. CAUSE: "What do you think is causing the leg pain?"     Unsure.  6. OTHER SYMPTOMS: "Do you have any other symptoms?" (e.g., chest pain, back pain, breathing difficulty, swelling, rash, fever, numbness, weakness)     Numbness in back of left leg (near "where hip would meet butt bone"), painful nodules.  Protocols used: Leg Pain-A-AH

## 2023-04-19 NOTE — Progress Notes (Signed)
 Call patient: X-ray shows some mild to moderate arthritis of your knee.  But no soft tissue changes and no sign of fracture or bony lesions.  I know you have been doing some stretches and a muscle relaxer as needed are you feeling any better?  If not please let us know

## 2023-05-31 ENCOUNTER — Ambulatory Visit: Payer: Self-pay

## 2023-05-31 NOTE — Telephone Encounter (Signed)
 Chief Complaint: Cyst to roof of mouth and behind left ear Symptoms: pain 3 behind ear, 8 when pressing Frequency: Onset 1-2 weeks Pertinent Negatives: Patient denies other symptoms Disposition: [] ED /[] Urgent Care (no appt availability in office) / [x] Appointment(In office/virtual)/ []  [place] [facility]/ [] Home Care/ [] Refused Recommended Disposition /[] [place] [facility]/ []  Follow-up with PCP Additional Notes: Patient says she has 2 cysts in her mouth on the bone on either side and on the bone on the back of left ear that feels like a small marble in size, unable to see the color, painful when touched in the mouth and constant pain in the ear today, requesting appointment tomorrow. Advised scheduled tomorrow with PCP.   Copied from CRM [id number]. Topic: Clinical - Red Word Triage >> May 31, 2023  5:01 PM Karole Pacer C wrote: Red Word that prompted transfer to Nurse Triage: Patient states she has had 2 cyst like pop up. One in her mouth and one on the outside of her ear. Patient states her ear is causing pain she would rate it as a 9. Reason for Disposition  [1] Swelling is painful to touch AND [2] no fever  Answer Assessment - Initial Assessment Questions 1. APPEARANCE of SWELLING: "What does it look like?"     Round, hard 2. SIZE: "How large is the swelling?" (e.g., inches, cm; or compare to size of pinhead, tip of pen, eraser, coin, pea, grape, ping pong ball)      Small marble 3. LOCATION: "Where is the swelling located?"     Roof of mouth on bone on each side, behind left ear on the bone 4. ONSET: "When did the swelling start?"     First one a month ago, others past 1-2 weeks 5. COLOR: "What color is it?" "Is there more than one color?"     Unable to see 6. PAIN: "Is there any pain?" If Yes, ask: "How bad is the pain?" (e.g., scale 1-10; or mild, moderate, severe)     - NONE (0): no pain   - MILD (1-3): doesn't interfere with normal activities    - MODERATE (4-7): interferes  with normal activities or awakens from sleep    - SEVERE (8-10): excruciating pain, unable to do any normal activities     Pressing is a 8; behind ear hurting 3-4 7. ITCH: "Does it itch?" If Yes, ask: "How bad is the itch?"      No 8. CAUSE: "What do you think caused the swelling?"     No 9 OTHER SYMPTOMS: "Do you have any other symptoms?" (e.g., fever)     No  Protocols used: Skin Lump or Localized Swelling-A-AH

## 2023-06-01 ENCOUNTER — Encounter: Payer: Self-pay | Admitting: Medical-Surgical

## 2023-06-01 ENCOUNTER — Ambulatory Visit (INDEPENDENT_AMBULATORY_CARE_PROVIDER_SITE_OTHER): Admitting: Medical-Surgical

## 2023-06-01 VITALS — BP 135/83 | HR 75 | Resp 20 | Ht 67.0 in | Wt 177.1 lb

## 2023-06-01 DIAGNOSIS — K1379 Other lesions of oral mucosa: Secondary | ICD-10-CM

## 2023-06-01 DIAGNOSIS — H938X2 Other specified disorders of left ear: Secondary | ICD-10-CM | POA: Diagnosis not present

## 2023-06-01 NOTE — Progress Notes (Unsigned)
   Established patient visit  History, exam, impression, and plan:  No problem-specific Assessment & Plan notes found for this encounter.   ROS  Physical Exam  Procedures performed this visit: None.  No follow-ups on file.  __________________________________ Thayer Ohm, DNP, APRN, FNP-BC Primary Care and Sports Medicine [facility]

## 2023-06-02 ENCOUNTER — Ambulatory Visit

## 2023-06-02 ENCOUNTER — Encounter: Payer: Self-pay | Admitting: Medical-Surgical

## 2023-06-02 DIAGNOSIS — R220 Localized swelling, mass and lump, head: Secondary | ICD-10-CM | POA: Diagnosis not present

## 2023-06-02 DIAGNOSIS — K1379 Other lesions of oral mucosa: Secondary | ICD-10-CM

## 2023-06-02 DIAGNOSIS — H938X2 Other specified disorders of left ear: Secondary | ICD-10-CM

## 2023-06-02 MED ORDER — IOHEXOL 300 MG/ML  SOLN
100.0000 mL | Freq: Once | INTRAMUSCULAR | Status: AC | PRN
  Administered 2023-06-02: 75 mL via INTRAVENOUS

## 2023-07-05 ENCOUNTER — Ambulatory Visit: Payer: Self-pay | Admitting: Medical-Surgical

## 2023-07-05 ENCOUNTER — Telehealth: Payer: Self-pay

## 2023-07-05 NOTE — Telephone Encounter (Signed)
 Spoke with patient and forwarded message to patient provider.

## 2023-07-05 NOTE — Telephone Encounter (Signed)
 Copied from CRM [id number]. Topic: General - Other >> Jul 05, 2023 12:52 PM Tammy Carrillo wrote: Reason for CRM: Patient was returning a call for [name], went over the results with patient, she is wanting to speak with [name] to just double check nothing else needed to be discussed about her labs, patients callback number is [phone].

## 2023-08-03 ENCOUNTER — Encounter: Payer: Self-pay | Admitting: Medical-Surgical

## 2023-08-03 ENCOUNTER — Ambulatory Visit (INDEPENDENT_AMBULATORY_CARE_PROVIDER_SITE_OTHER): Admitting: Medical-Surgical

## 2023-08-03 VITALS — BP 109/66 | HR 53 | Resp 20 | Ht 67.0 in | Wt 170.0 lb

## 2023-08-03 DIAGNOSIS — Z712 Person consulting for explanation of examination or test findings: Secondary | ICD-10-CM | POA: Diagnosis not present

## 2023-08-03 DIAGNOSIS — K1379 Other lesions of oral mucosa: Secondary | ICD-10-CM | POA: Diagnosis not present

## 2023-08-03 DIAGNOSIS — G44229 Chronic tension-type headache, not intractable: Secondary | ICD-10-CM | POA: Diagnosis not present

## 2023-08-03 DIAGNOSIS — N611 Abscess of the breast and nipple: Secondary | ICD-10-CM

## 2023-08-03 NOTE — Progress Notes (Unsigned)
   Established patient visit  History, exam, impression, and plan:  No problem-specific Assessment & Plan notes found for this encounter.   ROS  Physical Exam  Procedures performed this visit: None.  No follow-ups on file.  __________________________________ Thayer Ohm, DNP, APRN, FNP-BC Primary Care and Sports Medicine [facility]

## 2023-08-05 ENCOUNTER — Encounter: Payer: Self-pay | Admitting: Medical-Surgical

## 2023-08-09 ENCOUNTER — Ambulatory Visit: Payer: Self-pay

## 2023-08-09 NOTE — Telephone Encounter (Signed)
 APPOINTMENT MADE FOR 08/10/2023 AT 1 PM WITH PATIENT'S PCP ZADA PALIN, NP  FYI Only or Action Required?: FYI only for provider.  Patient was last seen in primary care on 08/03/2023 by PALIN ZADA, NP.  Called Nurse Triage reporting Breast Problem.  Symptoms began ABOUT TWO WEEKS AGO.  Interventions attempted: Nothing.  Symptoms are: gradually worsening.  Triage Disposition: See Physician Within 24 Hours  Patient/caregiver understands and will follow disposition?: Yes                                Copied from CRM [id number]. Topic: Clinical - Red Word Triage >> Aug 09, 2023  2:52 PM Mercer PEDLAR wrote: Red Word that prompted transfer to Nurse Triage: had lesion on breast, gotten worst, painful, hot. Reason for Disposition . [1] Breast looks infected (spreading redness, feels hot or painful to touch) AND [2] no fever  Answer Assessment - Initial Assessment Questions Patient states she came in on the 8th for a PCP appt unrelated to this complaint Open sore on lateral side of right breast Patient is advised that if anything worsens to go to the Emergency Room. She verbalized understanding.    1. SYMPTOM: What's the main symptom you're concerned about?  (e.g., lump, nipple discharge, pain, rash)     Open wound, pain, redness, warmth 2. LOCATION: Where is the open sore located?     Lateral side of right breast 3. ONSET: When did open sore/pain  start?     About 2 weeks ago 4. PRIOR HISTORY: Do you have any history of prior problems with your breasts? (e.g., breast cancer, breast implant, fibrocystic breast disease)     Once one other time---possibly inflamed gland in the past and patient got an antibiotic at that time 5. CAUSE: What do you think is causing this symptom?     Unsure exactly 6. OTHER SYMPTOMS: Do you have any other symptoms? (e.g., breast pain, fever, nipple discharge, redness or rash)     Redness and warmth with pain  Protocols  used: Breast Symptoms-A-AH

## 2023-08-10 ENCOUNTER — Encounter: Payer: Self-pay | Admitting: Medical-Surgical

## 2023-08-10 ENCOUNTER — Ambulatory Visit (INDEPENDENT_AMBULATORY_CARE_PROVIDER_SITE_OTHER): Admitting: Medical-Surgical

## 2023-08-10 VITALS — BP 122/58 | HR 44 | Resp 20 | Ht 67.0 in | Wt 171.0 lb

## 2023-08-10 DIAGNOSIS — G43909 Migraine, unspecified, not intractable, without status migrainosus: Secondary | ICD-10-CM

## 2023-08-10 DIAGNOSIS — N611 Abscess of the breast and nipple: Secondary | ICD-10-CM | POA: Diagnosis not present

## 2023-08-10 MED ORDER — DOXYCYCLINE HYCLATE 100 MG PO TABS: 100.0000 mg | ORAL_TABLET | Freq: Two times a day (BID) | ORAL | 0 refills | Status: AC

## 2023-08-10 NOTE — Progress Notes (Signed)
        Established patient visit  Discussed the use of AI scribe software for clinical note transcription with the patient, who gave verbal consent to proceed.  History of Present Illness   The patient presents with a recurrent right breast abscess that has worsened.  Right breast abscess - Recurrent abscess on the right breast - Lesion has increased in size, with swelling and pain - Pain is throbbing in character, most severe yesterday - Intermittent oozing and drainage from the abscess - Abscess sometimes closes and reopens, with continued drainage - Uncertain if fluid is still present in the abscess - No use of antibiotics or other medications for this episode - Manages with peroxide and waterproof patches     Physical Exam Vitals reviewed.  Constitutional:      General: She is not in acute distress.    Appearance: Normal appearance.  HENT:     Head: Normocephalic and atraumatic.  Cardiovascular:     Rate and Rhythm: Normal rate and regular rhythm.     Pulses: Normal pulses.  Pulmonary:     Effort: Pulmonary effort is normal. No respiratory distress.     Breath sounds: No wheezing.  Skin:    General: Skin is warm and dry.     Findings: Lesion (1.5-2cm round erythematous lesion with centralized green scab. Surrounding peeling skin. No palpable fluctuance or current drainage.) present.  Neurological:     Mental Status: She is alert and oriented to person, place, and time.  Psychiatric:        Mood and Affect: Mood normal.        Behavior: Behavior normal.        Thought Content: Thought content normal.        Judgment: Judgment normal.     Assessment and Plan    Right Breast Abscess Recurrent abscess with increased size and drainage, likely staph infection, possibly MRSA. Chose antibiotics over incision due to insufficient fluid and her preference. - Prescribe doxycycline  100 mg tablet twice daily for 7 days. - Advise warm compresses and daily site care with warm  soapy water, rinse, and pat dry. - Recommend bandage to protect from clothing. - Instruct to monitor for worsening symptoms and contact if no improvement. - Send prescription to [facility] on [address]  Follow-up for Right Breast Abscess Plan to ensure resolution and address complications. Consider wound culture if drainage increases. - Instruct to send a message if condition worsens or does not improve. - Plan for potential wound culture if drainage increases.     Return if symptoms worsen or fail to improve.  __________________________________ Zada FREDRIK Palin, DNP, APRN, FNP-BC Primary Care and Sports Medicine [facility]

## 2023-08-10 NOTE — Patient Instructions (Signed)
 Medications that can be used daily for prevention of migraines: 1.  Toprol XL (metoprolol succinate) 2.  Topamax 3.  Amitriptyline 4.  Effexor 5.  Propranolol 6.  Valproate  There are also injections that can be used but most insurances will require you to have tried at least 1 or 2 of the above options.  Medications that can be used as needed to stop breakthrough headaches: 1.  Imitrex  2.  Maxalt 3.  Zomig 4.  Relpax 5.  Nurtec 6.  Ubrelvy  7.  Zavzpret  Of course you can always use Tylenol  or NSAIDs if no contraindications.

## 2023-08-25 ENCOUNTER — Telehealth: Payer: Self-pay

## 2023-08-25 NOTE — Telephone Encounter (Signed)
 Copied from CRM [id number]. Topic: General - Other >> Aug 25, 2023 11:30 AM Miquel SAILOR wrote: Reason for CRM: CT MAXILLOFACIAL W & WO CONTRAST (Accession [id number]) (Order [id number])  Patient requesting for Copy of scan form imaging for ENT.Needs call back [phone]

## 2023-08-25 NOTE — Telephone Encounter (Signed)
 Advised patient she will need to go to the imaging department for the images.

## 2023-09-28 ENCOUNTER — Encounter: Payer: Self-pay | Admitting: Sports Medicine
